# Patient Record
Sex: Male | Born: 1985 | Race: Black or African American | Hispanic: No | Marital: Single | Smoking: Former smoker
Health system: Southern US, Community
[De-identification: ages and names within clinical notes are randomized; demographics above are authoritative.]

## PROBLEM LIST (undated history)

## (undated) HISTORY — PX: HIP FRACTURE SURGERY: SHX118

## (undated) HISTORY — PX: PELVIC FRACTURE SURGERY: SHX119

---

## 2009-09-09 DIAGNOSIS — IMO0002 Reserved for concepts with insufficient information to code with codable children: Secondary | ICD-10-CM | POA: Insufficient documentation

## 2009-09-09 DIAGNOSIS — R0689 Other abnormalities of breathing: Secondary | ICD-10-CM | POA: Insufficient documentation

## 2009-09-09 DIAGNOSIS — S32401A Unspecified fracture of right acetabulum, initial encounter for closed fracture: Secondary | ICD-10-CM | POA: Insufficient documentation

## 2021-08-06 ENCOUNTER — Encounter (HOSPITAL_COMMUNITY): Payer: Self-pay

## 2021-08-06 ENCOUNTER — Ambulatory Visit (INDEPENDENT_AMBULATORY_CARE_PROVIDER_SITE_OTHER): Payer: Self-pay

## 2021-08-06 ENCOUNTER — Other Ambulatory Visit: Payer: Self-pay

## 2021-08-06 ENCOUNTER — Ambulatory Visit (HOSPITAL_COMMUNITY)
Admission: EM | Admit: 2021-08-06 | Discharge: 2021-08-06 | Disposition: A | Discharge status: Home / Self Care | Payer: Self-pay | Attending: Physician Assistant | Admitting: Physician Assistant

## 2021-08-06 DIAGNOSIS — M79675 Pain in left toe(s): Secondary | ICD-10-CM

## 2021-08-06 MED ORDER — SULFAMETHOXAZOLE-TRIMETHOPRIM 800-160 MG PO TABS: 1.0000 | ORAL_TABLET | Freq: Two times a day (BID) | ORAL | 0 refills | Status: AC

## 2021-08-06 MED ORDER — NAPROXEN 500 MG PO TABS: 500.0000 mg | ORAL_TABLET | Freq: Two times a day (BID) | ORAL | 0 refills | Status: DC

## 2021-08-06 NOTE — Discharge Instructions (Signed)
Take Bactrim DS twice daily for 7 days.  If you develop any rash or oral lesions please stop the medication to be seen immediately.  Take Naprosyn twice daily.  Do not take NSAIDs including aspirin, ibuprofen/Advil, naproxen/Aleve with this medication.  Use postop shoe to help provide relief.  Keep your foot elevated and use ice for additional symptom relief.  If symptoms or not improving quickly please call to schedule an appoint with podiatry for follow-up.  If anything worsens return for reevaluation. ?

## 2021-08-06 NOTE — ED Provider Notes (Signed)
?[facility] ? ? ? ?CSN: [id number] ?Arrival date & time: 08/06/21  1911 ? ? ?  ? ?History   ?Chief Complaint ?Chief Complaint  ?Patient presents with  ? Toe Pain  ? ? ?HPI ?Jonathan Nunez is a 36 y.o. male.  ? ?Patient presents today with a several week history of left eye pain.  Reports is gradually been worsening prompting evaluation today.  Pain is rated 8 on a 0-10 pain scale, described as aching, localized to pad of left great toe, worse with palpation or attempted ambulation, no alleviating factors notified.  He did have several pedicures recently but symptoms had already started at the time that he decided to do this.  They addressed fungal disease and placed acrylic toenails on to help with growth.  He denies any history of diabetes.  He denies any fever, nausea, vomiting, lightheadedness.  He denies any recent antibiotic use.  He has not tried any over-the-counter medication for symptom management.  He denies any known injury or change in activity prior to symptom onset.  Denies history of gout.  Denies any increased alcohol consumption or medication changes. ? ? ?History reviewed. No pertinent past medical history. ? ?There are no problems to display for this patient. ? ? ?History reviewed. No pertinent surgical history. ? ? ? ? ?Home Medications   ? ?Prior to Admission medications   ?Medication Sig Start Date End Date Taking? Authorizing Provider  ?naproxen (NAPROSYN) 500 MG tablet Take 1 tablet (500 mg total) by mouth 2 (two) times daily. 08/06/21  Yes Collen Vincent K, PA-C  ?sulfamethoxazole-trimethoprim (BACTRIM DS) 800-160 MG tablet Take 1 tablet by mouth 2 (two) times daily for 7 days. 08/06/21 08/13/21 Yes Mykal Batiz, Derry Skill, PA-C  ? ? ?Family History ?History reviewed. No pertinent family history. ? ?Social History ?  ? ? ?Allergies   ?Patient has no allergy information on record. ? ? ?Review of Systems ?Review of Systems  ?Constitutional:  Positive for activity change. Negative for appetite  change, fatigue and fever.  ?Respiratory:  Negative for cough and shortness of breath.   ?Cardiovascular:  Negative for chest pain.  ?Gastrointestinal:  Negative for abdominal pain, diarrhea, nausea and vomiting.  ?Musculoskeletal:  Positive for arthralgias and joint swelling. Negative for myalgias.  ?Skin:  Negative for color change and wound.  ?Neurological:  Negative for dizziness, light-headedness and headaches.  ? ? ?Physical Exam ?Triage Vital Signs ?ED Triage Vitals  ?Enc Vitals Group  ?   BP 08/06/21 1929 (!) 145/88  ?   Pulse --   ?   Resp 08/06/21 1929 16  ?   Temp 08/06/21 1930 98.3 ?F (36.8 ?C)  ?   Temp Source 08/06/21 1929 Oral  ?   SpO2 08/06/21 1929 100 %  ?   Weight --   ?   Height --   ?   Head Circumference --   ?   Peak Flow --   ?   Pain Score --   ?   Pain Loc --   ?   Pain Edu? --   ?   Excl. in [place]? --   ? ?No data found. ? ?Updated Vital Signs ?BP (!) 145/88 (BP Location: Left Arm)   Temp 98.3 ?F (36.8 ?C)   Resp 16   SpO2 100%  ? ?Visual Acuity ?Right Eye Distance:   ?Left Eye Distance:   ?Bilateral Distance:   ? ?Right Eye Near:   ?Left Eye Near:    ?Bilateral  Near:    ? ?Physical Exam ?Vitals reviewed.  ?Constitutional:   ?   General: He is awake.  ?   Appearance: Normal appearance. He is well-developed. He is not ill-appearing.  ?   Comments: Very pleasant male appears stated age in no acute distress sitting comfortably in exam room  ?HENT:  ?   Head: Normocephalic and atraumatic.  ?   Mouth/Throat:  ?   Pharynx: No oropharyngeal exudate, posterior oropharyngeal erythema or uvula swelling.  ?Cardiovascular:  ?   Rate and Rhythm: Normal rate and regular rhythm.  ?   Pulses:     ?     Posterior tibial pulses are 2+ on the left side.  ?   Heart sounds: Normal heart sounds, S1 normal and S2 normal. No murmur heard. ?Pulmonary:  ?   Effort: Pulmonary effort is normal.  ?   Breath sounds: Normal breath sounds. No stridor. No wheezing, rhonchi or rales.  ?   Comments: Clear to auscultation  bilaterally ?Musculoskeletal:  ?   Left foot: Normal range of motion. No deformity.  ?Feet:  ?   Left foot:  ?   Protective Sensation: 10 sites tested.  10 sites sensed.  ?   Skin integrity: Callus and dry skin present. No ulcer, blister or skin breakdown.  ?   Comments: Unable to assess capillary refill due to acrylic toenail on great toe.  Capillary fill within 2 seconds other toes of left foot with normal pulses.  Foot neurovascularly intact.  Widespread dry skin.  Small callus noted at base of left great toe.  Tenderness to palpation over distal left great toe.  No erythema, wound, bleeding, drainage noted. ?Neurological:  ?   Mental Status: He is alert.  ?Psychiatric:     ?   Behavior: Behavior is cooperative.  ? ? ? ?UC Treatments / Results  ?Labs ?(all labs ordered are listed, but only abnormal results are displayed) ?Labs Reviewed - No data to display ? ?EKG ? ? ?Radiology ?DG Toe Great Left ? ?Result Date: 08/06/2021 ?CLINICAL DATA:  Left first toe pain for 1-2 weeks. No reported injury. EXAM: LEFT GREAT TOE COMPARISON:  None. FINDINGS: No fracture or dislocation. No suspicious focal osseous lesions. No erosions or periosteal reaction. No radiopaque foreign bodies or tophaceous soft tissue calcifications. IMPRESSION: No acute osseous abnormality. Electronically Signed   By: Ilona Sorrel M.D.   On: 08/06/2021 20:05   ? ?Procedures ?Procedures (including critical care time) ? ?Medications Ordered in UC ?Medications - No data to display ? ?Initial Impression / Assessment and Plan / UC Course  ?I have reviewed the triage vital signs and the nursing notes. ? ?Pertinent labs & imaging results that were available during my care of the patient were reviewed by me and considered in my medical decision making (see chart for details). ? ?  ? ?X-ray was obtained given prolonged symptoms with difficulty ambulating which showed no osseous abnormality.  No clear evidence of infection but given clinical presentation and  recent pedicure will cover with Bactrim DS.  He was placed in postop shoe for comfort.  Was also started on Naprosyn for pain relief.  Discussed that he should not take additional NSAIDs with this medication due to risk of GI bleeding.  Recommended conservative treatment measures including RICE protocol.  Discussed that if symptoms or not improving he should follow-up with podiatry and was given contact information for local provider.  Discussed alarm symptoms that warrant emergent evaluation.  Strict  return precautions given to which she expressed understanding.  Work excuse note provided. ? ?Final Clinical Impressions(s) / UC Diagnoses  ? ?Final diagnoses:  ?Pain of toe of left foot  ? ? ? ?Discharge Instructions   ? ?  ?Take Bactrim DS twice daily for 7 days.  If you develop any rash or oral lesions please stop the medication to be seen immediately.  Take Naprosyn twice daily.  Do not take NSAIDs including aspirin, ibuprofen/Advil, naproxen/Aleve with this medication.  Use postop shoe to help provide relief.  Keep your foot elevated and use ice for additional symptom relief.  If symptoms or not improving quickly please call to schedule an appoint with podiatry for follow-up.  If anything worsens return for reevaluation. ? ? ? ? ?ED Prescriptions   ? ? Medication Sig Dispense Auth. Provider  ? sulfamethoxazole-trimethoprim (BACTRIM DS) 800-160 MG tablet Take 1 tablet by mouth 2 (two) times daily for 7 days. 14 tablet Mikel Hardgrove K, PA-C  ? naproxen (NAPROSYN) 500 MG tablet Take 1 tablet (500 mg total) by mouth 2 (two) times daily. 30 tablet Jamil Castillo K, PA-C  ? ?  ? ?PDMP not reviewed this encounter. ?  ?Terrilee Croak, PA-C ?08/06/21 2013 ? ?

## 2021-08-06 NOTE — ED Triage Notes (Signed)
Pt presents for left toe pain x 1-2 weeks. No known injury to his toe. ?

## 2021-09-17 ENCOUNTER — Ambulatory Visit (INDEPENDENT_AMBULATORY_CARE_PROVIDER_SITE_OTHER): Payer: Self-pay | Admitting: Podiatry

## 2021-09-17 ENCOUNTER — Encounter: Payer: Self-pay | Admitting: Podiatry

## 2021-09-17 DIAGNOSIS — M899 Disorder of bone, unspecified: Secondary | ICD-10-CM

## 2021-09-17 DIAGNOSIS — M7752 Other enthesopathy of left foot: Secondary | ICD-10-CM

## 2021-09-17 MED ORDER — MELOXICAM 15 MG PO TABS: 15.0000 mg | ORAL_TABLET | Freq: Every day | ORAL | 0 refills | Status: DC

## 2021-09-17 NOTE — Progress Notes (Signed)
?  Subjective:  ?Patient ID: Jonathan Nunez, male    DOB: January 28, 1986,   MRN: [id number] ? ?Chief Complaint  ?Patient presents with  ? Toe Pain  ?  Plantar hallux and sub 1st MPJ left - aching x 2 months, no injury, hurts to walk a lot, tried change of shoes and callus remover-no help, went to ER - xrayed but no diagnosis   ? New Patient (Initial Visit)  ? ? ?36 y.o. male presents for concern of left hallux and MPJ pain that has been going on for two months. Relates it hurts when walking. Has tried changing shoes with no difference. Went to ER and had X-rays but no diagnosis and has tried callus remover with no help.   . Denies any other pedal complaints. Denies n/v/f/c.  ? ?No past medical history on file. ? ?Objective:  ?Physical Exam: ?Vascular: DP/PT pulses 2/4 bilateral. CFT <3 seconds. Normal hair growth on digits. No edema.  ?Skin. No lacerations or abrasions bilateral feet. Some small hyperkeratotic areas but minimal.  ?Musculoskeletal: MMT 5/5 bilateral lower extremities in DF, PF, Inversion and Eversion. Deceased ROM in DF of ankle joint. Tender to hallux IPJ in area of sesamoid. Some pain to palpation of plantar first MPJ.  ?Neurological: Sensation intact to light touch.  ? ?Assessment:  ? ?1. Capsulitis of toe of left foot   ? ? ? ?Plan:  ?Patient was evaluated and treated and all questions answered. ?Discussed capsulitis and inflammation of joint and treatment options with patient.  ?Radiographs reviewed and discussed with patient.  ?Meloxicam sent to pharmacy.   ?Discussed stiff sole shoes and recommend use of carbon fiber foot plate.  ?Discussed padding  ?Discussed if pain does not improve may consider PT and/or MRI for further surgical planning.  ?Patient to return in 6 weeks or sooner if concerns arise.  ? ? ?Louann Sjogren, DPM  ? ? ?

## 2021-10-29 ENCOUNTER — Ambulatory Visit (INDEPENDENT_AMBULATORY_CARE_PROVIDER_SITE_OTHER): Payer: Self-pay | Admitting: Podiatry

## 2021-10-29 DIAGNOSIS — Z91199 Patient's noncompliance with other medical treatment and regimen due to unspecified reason: Secondary | ICD-10-CM

## 2021-10-29 NOTE — Progress Notes (Signed)
No show

## 2022-01-24 ENCOUNTER — Ambulatory Visit (HOSPITAL_COMMUNITY)
Admission: EM | Admit: 2022-01-24 | Discharge: 2022-01-24 | Disposition: A | Discharge status: Home / Self Care | Payer: Self-pay | Attending: Physician Assistant | Admitting: Physician Assistant

## 2022-01-24 ENCOUNTER — Encounter (HOSPITAL_COMMUNITY): Payer: Self-pay

## 2022-01-24 DIAGNOSIS — R809 Proteinuria, unspecified: Secondary | ICD-10-CM

## 2022-01-24 DIAGNOSIS — R5383 Other fatigue: Secondary | ICD-10-CM | POA: Insufficient documentation

## 2022-01-24 DIAGNOSIS — R42 Dizziness and giddiness: Secondary | ICD-10-CM

## 2022-01-24 DIAGNOSIS — R002 Palpitations: Secondary | ICD-10-CM

## 2022-01-24 DIAGNOSIS — R197 Diarrhea, unspecified: Secondary | ICD-10-CM

## 2022-01-24 DIAGNOSIS — R5381 Other malaise: Secondary | ICD-10-CM

## 2022-01-24 LAB — POCT URINALYSIS DIPSTICK, ED / UC
Bilirubin Urine: NEGATIVE
Glucose, UA: NEGATIVE mg/dL
Hgb urine dipstick: NEGATIVE
Leukocytes,Ua: NEGATIVE
Nitrite: NEGATIVE
Protein, ur: >=300 mg/dL — AB
Specific Gravity, Urine: >=1.03 (ref 1.005–1.030)
Urobilinogen, UA: 1 mg/dL (ref 0.0–1.0)
pH: 7 (ref 5.0–8.0)

## 2022-01-24 LAB — COMPREHENSIVE METABOLIC PANEL
ALT: 55 U/L — ABNORMAL HIGH (ref 0–44)
AST: 118 U/L — ABNORMAL HIGH (ref 15–41)
Albumin: 5 g/dL (ref 3.5–5.0)
Alkaline Phosphatase: 51 U/L (ref 38–126)
Anion gap: 13 (ref 5–15)
BUN: 11 mg/dL (ref 6–20)
CO2: 28 mmol/L (ref 22–32)
Calcium: 9.7 mg/dL (ref 8.9–10.3)
Chloride: 96 mmol/L — ABNORMAL LOW (ref 98–111)
Creatinine, Ser: 0.78 mg/dL (ref 0.61–1.24)
GFR, Estimated: >60 mL/min (ref >60)
Glucose, Bld: 93 mg/dL (ref 70–99)
Potassium: 4.4 mmol/L (ref 3.5–5.1)
Sodium: 137 mmol/L (ref 135–145)
Total Bilirubin: 0.7 mg/dL (ref 0.3–1.2)
Total Protein: 7.7 g/dL (ref 6.5–8.1)

## 2022-01-24 LAB — CBC
HCT: 20.6 % — ABNORMAL LOW (ref 39.0–52.0)
Hemoglobin: 7.1 g/dL — ABNORMAL LOW (ref 13.0–17.0)
MCH: 37 pg — ABNORMAL HIGH (ref 26.0–34.0)
MCHC: 34.5 g/dL (ref 30.0–36.0)
MCV: 107.3 fL — ABNORMAL HIGH (ref 80.0–100.0)
Platelets: 123 10*3/uL — ABNORMAL LOW (ref 150–400)
RBC: 1.92 MIL/uL — ABNORMAL LOW (ref 4.22–5.81)
RDW: 26.2 % — ABNORMAL HIGH (ref 11.5–15.5)
WBC: 8.1 10*3/uL (ref 4.0–10.5)
nRBC: 5.9 % — ABNORMAL HIGH (ref 0.0–0.2)

## 2022-01-24 LAB — TSH: TSH: 1.827 u[IU]/mL (ref 0.350–4.500)

## 2022-01-24 LAB — CBG MONITORING, ED: Glucose-Capillary: 99 mg/dL (ref 70–99)

## 2022-01-24 LAB — T4, FREE: Free T4: 0.69 ng/dL (ref 0.61–1.12)
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
## 2022-01-24 NOTE — ED Triage Notes (Signed)
Pt states has been out of work since Wednesday, states has had 3 episodes of dizziness and fatigue. States has had diarrhea and no appetite in over 2 wks.

## 2022-01-24 NOTE — Discharge Instructions (Signed)
Your EKG was normal.  Your urine did have some protein so we are ordering additional studies to investigate this further.  Your blood sugar was normal.  You do not have any blood in your stool.  We will contact you with your lab work within a few days.  Make sure that you rest and drink plenty of fluid.  Eat small frequent meals.  It is important that you follow-up with a primary care provider.  We will try to establish you with someone and they should reach out to call and schedule an appointment.  In the meantime I do recommend you follow-up with our clinic within the week.  If you have any worsening symptoms including chest pain, shortness of breath, headache, vision change, weakness, passing out you need to go to the emergency room.  I also recommend that you follow-up with a cardiologist given your lightheadedness and palpitations.  Call them to schedule an appointment.

## 2022-01-25 ENCOUNTER — Observation Stay (HOSPITAL_BASED_OUTPATIENT_CLINIC_OR_DEPARTMENT_OTHER)
Admission: EM | Admit: 2022-01-25 | Discharge: 2022-01-27 | Disposition: A | Discharge status: Home / Self Care | Payer: Self-pay | Attending: Internal Medicine | Admitting: Internal Medicine

## 2022-01-25 ENCOUNTER — Encounter (HOSPITAL_BASED_OUTPATIENT_CLINIC_OR_DEPARTMENT_OTHER): Payer: Self-pay | Admitting: Emergency Medicine

## 2022-01-25 ENCOUNTER — Other Ambulatory Visit: Payer: Self-pay

## 2022-01-25 ENCOUNTER — Encounter (HOSPITAL_COMMUNITY): Payer: Self-pay

## 2022-01-25 DIAGNOSIS — Z87891 Personal history of nicotine dependence: Secondary | ICD-10-CM | POA: Insufficient documentation

## 2022-01-25 DIAGNOSIS — D649 Anemia, unspecified: Secondary | ICD-10-CM

## 2022-01-25 DIAGNOSIS — D696 Thrombocytopenia, unspecified: Secondary | ICD-10-CM | POA: Insufficient documentation

## 2022-01-25 DIAGNOSIS — D539 Nutritional anemia, unspecified: Principal | ICD-10-CM

## 2022-01-25 DIAGNOSIS — E538 Deficiency of other specified B group vitamins: Secondary | ICD-10-CM | POA: Insufficient documentation

## 2022-01-25 LAB — BASIC METABOLIC PANEL
Anion gap: 8 (ref 5–15)
BUN: 12 mg/dL (ref 6–20)
CO2: 28 mmol/L (ref 22–32)
Calcium: 8.9 mg/dL (ref 8.9–10.3)
Chloride: 100 mmol/L (ref 98–111)
Creatinine, Ser: 0.88 mg/dL (ref 0.61–1.24)
GFR, Estimated: >60 mL/min (ref >60)
Glucose, Bld: 102 mg/dL — ABNORMAL HIGH (ref 70–99)
Potassium: 3.9 mmol/L (ref 3.5–5.1)
Sodium: 136 mmol/L (ref 135–145)

## 2022-01-25 LAB — CBC
HCT: 19.6 % — ABNORMAL LOW (ref 39.0–52.0)
Hemoglobin: 6.8 g/dL — CL (ref 13.0–17.0)
MCH: 36.6 pg — ABNORMAL HIGH (ref 26.0–34.0)
MCHC: 34.7 g/dL (ref 30.0–36.0)
MCV: 105.4 fL — ABNORMAL HIGH (ref 80.0–100.0)
Platelets: 117 10*3/uL — ABNORMAL LOW (ref 150–400)
RBC: 1.86 MIL/uL — ABNORMAL LOW (ref 4.22–5.81)
RDW: 28 % — ABNORMAL HIGH (ref 11.5–15.5)
WBC: 7.1 10*3/uL (ref 4.0–10.5)
nRBC: 6.7 % — ABNORMAL HIGH (ref 0.0–0.2)

## 2022-01-25 LAB — FERRITIN: Ferritin: 81 ng/mL (ref 24–336)

## 2022-01-25 LAB — RETICULOCYTES
Immature Retic Fract: 13.1 % (ref 2.3–15.9)
RBC.: 1.83 MIL/uL — ABNORMAL LOW (ref 4.22–5.81)
Retic Count, Absolute: 30 10*3/uL (ref 19.0–186.0)
Retic Ct Pct: 1.6 % (ref 0.4–3.1)

## 2022-01-25 LAB — IRON AND TIBC
Iron: 137 ug/dL (ref 45–182)
Saturation Ratios: 43 % — ABNORMAL HIGH (ref 17.9–39.5)
TIBC: 318 ug/dL (ref 250–450)
UIBC: 181 ug/dL

## 2022-01-25 LAB — FOLATE: Folate: 20.8 ng/mL (ref >5.9)

## 2022-01-25 LAB — VITAMIN B12: Vitamin B-12: 73 pg/mL — ABNORMAL LOW (ref 180–914)

## 2022-01-25 LAB — PREPARE RBC (CROSSMATCH)

## 2022-01-25 MED ORDER — ONDANSETRON HCL 4 MG PO TABS: 4.0000 mg | ORAL_TABLET | Freq: Four times a day (QID) | ORAL | Status: DC | PRN

## 2022-01-25 MED ORDER — ONDANSETRON HCL 4 MG/2ML IJ SOLN: 4.0000 mg | Freq: Four times a day (QID) | INTRAMUSCULAR | Status: DC | PRN

## 2022-01-25 MED ORDER — ACETAMINOPHEN 325 MG PO TABS: 650.0000 mg | ORAL_TABLET | Freq: Four times a day (QID) | ORAL | Status: DC | PRN

## 2022-01-25 MED ORDER — CYANOCOBALAMIN 1000 MCG/ML IJ SOLN
1000.0000 ug | Freq: Once | INTRAMUSCULAR | Status: AC
  Administered 2022-01-26: 1000 ug via INTRAMUSCULAR
  Filled 2022-01-25: qty 1

## 2022-01-25 MED ORDER — SODIUM CHLORIDE 0.9 % IV SOLN
10.0000 mL/h | Freq: Once | INTRAVENOUS | Status: AC
  Administered 2022-01-26: 10 mL/h via INTRAVENOUS

## 2022-01-25 MED ORDER — ACETAMINOPHEN 650 MG RE SUPP: 650.0000 mg | Freq: Four times a day (QID) | RECTAL | Status: DC | PRN

## 2022-01-25 MED ORDER — SENNOSIDES-DOCUSATE SODIUM 8.6-50 MG PO TABS: 1.0000 | ORAL_TABLET | Freq: Every evening | ORAL | Status: DC | PRN

## 2022-01-25 NOTE — ED Notes (Signed)
[facility] on unit to transport pt  

## 2022-01-25 NOTE — ED Provider Notes (Signed)
[facility] EMERGENCY DEPARTMENT Provider Note   CSN: [id number] Arrival date & time: 01/25/22  1712     History  Chief Complaint  Patient presents with   Near Syncope    Jonathan Nunez is a 36 y.o. male.  Patient presents to the emergency department today for symptomatic anemia.  Patient presented to urgent care yesterday for several days of lightheadedness, fatigue, exertional shortness of breath.  This has been occurring for the past 1 to 2 weeks.  Patient has had some diarrhea, dark appearing, however reports green stool today.  Labs drawn at urgent care yesterday revealed microcytic anemia.  Patient denies having similar symptoms in the past.  He has never needed a blood transfusion.  He denies history of peptic ulcer disease, heavy NSAID use.  Was prescribed an NSAID after podiatry appointment earlier in the year, but is not on medication chronically stating he only took this a couple of times earlier this year.  He denies alcohol use.  He states that he is an "occasional" smoker.  Denies family history of blood problems or need for transfusion.  Hemoccult and thyroid studies yesterday were negative.       Home Medications Prior to Admission medications   Medication Sig Start Date End Date Taking? Authorizing Provider  meloxicam (MOBIC) 15 MG tablet Take 1 tablet (15 mg total) by mouth daily. 09/17/21   Louann Sjogren, DPM      Allergies    Patient has no known allergies.    Review of Systems   Review of Systems  Physical Exam Updated Vital Signs BP 130/79   Pulse 72   Temp 98.3 F (36.8 C) (Oral)   Resp 18   Ht 5\' 8"  (1.727 m)   Wt 72.6 kg   SpO2 100%   BMI 24.33 kg/m   Physical Exam Vitals and nursing note reviewed.  Constitutional:      General: He is not in acute distress.    Appearance: He is well-developed.  HENT:     Head: Normocephalic and atraumatic.     Right Ear: External ear normal.     Left Ear: External ear normal.     Mouth/Throat:      Mouth: Mucous membranes are moist.  Eyes:     General:        Right eye: No discharge.        Left eye: No discharge.     Comments: Pale conjunctiva  Cardiovascular:     Rate and Rhythm: Normal rate and regular rhythm.     Heart sounds: Normal heart sounds.  Pulmonary:     Effort: Pulmonary effort is normal.     Breath sounds: Normal breath sounds.  Abdominal:     Palpations: Abdomen is soft.     Tenderness: There is no abdominal tenderness.  Musculoskeletal:     Cervical back: Normal range of motion and neck supple.  Skin:    General: Skin is warm and dry.  Neurological:     Mental Status: He is alert.     ED Results / Procedures / Treatments   Labs (all labs ordered are listed, but only abnormal results are displayed) Labs Reviewed  BASIC METABOLIC PANEL - Abnormal; Notable for the following components:      Result Value   Glucose, Bld 102 (*)    All other components within normal limits  CBC - Abnormal; Notable for the following components:   RBC 1.86 (*)    Hemoglobin 6.8 (*)  HCT 19.6 (*)    MCV 105.4 (*)    MCH 36.6 (*)    RDW 28.0 (*)    Platelets 117 (*)    nRBC 6.7 (*)    All other components within normal limits  VITAMIN B12  IRON AND TIBC  FERRITIN  FOLATE  RETICULOCYTES    EKG None  Radiology No results found.  Procedures Procedures    Medications Ordered in ED Medications - No data to display  ED Course/ Medical Decision Making/ A&P    Patient seen and examined. History obtained directly from patient.  Reviewed previous clinic notes.  Reviewed urgent care note from yesterday.  Labs/EKG: CBC and BMP ordered in triage personally reviewed and interpreted.  BMP unremarkable, CBC with hemoglobin 6.8 with MCV elevated 105.4.  Anemia panel ordered.  Imaging: None ordered  Medications/Fluids: None ordered  Most recent vital signs reviewed and are as follows: BP 130/79   Pulse 72   Temp 98.3 F (36.8 C) (Oral)   Resp 18   Ht 5'  8" (1.727 m)   Wt 72.6 kg   SpO2 100%   BMI 24.33 kg/m   Initial impression: Macrocytic anemia, symptomatic, he would need transfusion and further work-up.  Degree of macrocytosis makes me think that this is in part chronic, however however has had development of symptoms recently and dark stools.  8:25 PM Pt discussed earlier with Dr. Anitra Lauth. Reassessment performed. Patient appears stable.  Patient initially adamant about driving himself to the emergency department because his vehicle would be left behind.  After discussion of risks and benefits, he is amenable to [facility] transport.  I consulted with Dr. Margo Aye, [facility] hospitalist by telephone.  She requests ED to ED transfer so that blood transfusion can be started.  Requested GI consult if patient is on chronic NSAIDs, which he is not, so will defer.  Guaiac yesterday was negative.  No abdominal pain.  Discussed with Dr. Jeraldine Loots at [facility] ED via secure chat who accepts patient.  Reviewed pertinent lab work and imaging with patient at bedside. Questions answered.   Most current vital signs reviewed and are as follows: BP 132/68   Pulse 79   Temp 98.3 F (36.8 C)   Resp 18   Ht 5\' 8"  (1.727 m)   Wt 72.6 kg   SpO2 100%   BMI 24.33 kg/m   Plan: Admit to hospital.                            Medical Decision Making Amount and/or Complexity of Data Reviewed Labs: ordered.  Risk Decision regarding hospitalization.   Patient with symptomatic anemia, progressive symptoms.  No concerns for active GI bleeding.  Vital signs are stable without tachycardia or hypotension.  He will be admitted for blood transfusion and anemia work-up.  No indication of acute GI bleeding at this point.        Final Clinical Impression(s) / ED Diagnoses Final diagnoses:  Macrocytic anemia    Rx / DC Orders ED Discharge Orders     None         , PA-C 01/25/22 2028    2029, MD 01/28/22 1506

## 2022-01-25 NOTE — ED Notes (Addendum)
Patient denies any dizziness to today. Patient denies any chest pain,shortness of breath.Reports diarrhea x2 yesterday

## 2022-01-25 NOTE — ED Notes (Signed)
ED TO INPATIENT HANDOFF REPORT  ED Nurse Name and Phone #: Julian Reil [phone]  S Name/Age/Gender Jonathan Nunez 36 y.o. male Room/Bed: WA07/WA07  Code Status   Code Status: Full Code  Home/SNF/Other Home Patient oriented to: self, place, time, and situation Is this baseline? Yes   Triage Complete: Triage complete  Chief Complaint Symptomatic anemia [D64.9]  Triage Note Patient reports intermittent dizziness and fatigue for the last 2 weeks, states was messaged by his provider yesterday and was told he had low blood counts, but did not tell him what levels were low.    Allergies No Known Allergies  Level of Care/Admitting Diagnosis ED Disposition     ED Disposition  Admit   Condition  --   Comment  Hospital Area: [facility] [[id number]]  Level of Care: Med-Surg [16]  May place patient in observation at [facility] or [facility] if equivalent level of care is available:: Yes  Covid Evaluation: Asymptomatic - no recent exposure (last 10 days) testing not required  Diagnosis: Symptomatic anemia [[id number]]  Admitting Physician: Briscoe Deutscher [[id number]]  Attending Physician: Briscoe Deutscher [[id number]]          B Medical/Surgery History History reviewed. No pertinent past medical history. Past Surgical History:  Procedure Laterality Date   HIP FRACTURE SURGERY       A IV Location/Drains/Wounds Patient Lines/Drains/Airways Status     Active Line/Drains/Airways     Name Placement date Placement time Site Days   Peripheral IV 01/25/22 20 G Right Antecubital 01/25/22  1924  Antecubital  less than 1            Intake/Output Last 24 hours No intake or output data in the 24 hours ending 01/25/22 2311  Labs/Imaging Results for orders placed or performed during the hospital encounter of 01/25/22 (from the past 48 hour(s))  Basic metabolic panel     Status: Abnormal   Collection Time: 01/25/22  5:32 PM  Result Value Ref Range   Sodium 136  135 - 145 mmol/L   Potassium 3.9 3.5 - 5.1 mmol/L   Chloride 100 98 - 111 mmol/L   CO2 28 22 - 32 mmol/L   Glucose, Bld 102 (H) 70 - 99 mg/dL    Comment: Glucose reference range applies only to samples taken after fasting for at least 8 hours.   BUN 12 6 - 20 mg/dL   Creatinine, Ser 9.92 0.61 - 1.24 mg/dL   Calcium 8.9 8.9 - 42.6 mg/dL   GFR, Estimated >83 >41 mL/min    Comment: (NOTE) Calculated using the CKD-EPI Creatinine Equation (2021)    Anion gap 8 5 - 15    Comment: Performed at [facility], [address] [place], [place] [address]  CBC     Status: Abnormal   Collection Time: 01/25/22  5:32 PM  Result Value Ref Range   WBC 7.1 4.0 - 10.5 K/uL    Comment: REPEATED TO VERIFY WHITE COUNT CONFIRMED ON SMEAR    RBC 1.86 (L) 4.22 - 5.81 MIL/uL   Hemoglobin 6.8 (LL) 13.0 - 17.0 g/dL    Comment: REPEATED TO VERIFY THIS CRITICAL RESULT HAS VERIFIED AND BEEN CALLED TO GOUGE S RN BY CAROL PHILLIPS ON 09 09 2023 AT 1751, AND HAS BEEN READ BACK.     HCT 19.6 (L) 39.0 - 52.0 %    Comment: REPEATED TO VERIFY   MCV 105.4 (H) 80.0 - 100.0 fL   MCH 36.6 (  H) 26.0 - 34.0 pg   MCHC 34.7 30.0 - 36.0 g/dL   RDW 25.4 (H) 98.2 - 64.1 %   Platelets 117 (L) 150 - 400 K/uL    Comment: SPECIMEN CHECKED FOR CLOTS REPEATED TO VERIFY PLATELET COUNT CONFIRMED BY SMEAR    nRBC 6.7 (H) 0.0 - 0.2 %    Comment: Performed at [facility], [address] [place], [place] [address]  Reticulocytes     Status: Abnormal   Collection Time: 01/25/22  5:32 PM  Result Value Ref Range   Retic Ct Pct 1.6 0.4 - 3.1 %   RBC. 1.83 (L) 4.22 - 5.81 MIL/uL   Retic Count, Absolute 30.0 19.0 - 186.0 K/uL    Comment: RESULTS CONFIRMED BY MANUAL DILUTION   Immature Retic Fract 13.1 2.3 - 15.9 %    Comment: Performed at [facility], [address], [place], [place] [address]  Vitamin B12     Status: Abnormal   Collection Time: 01/25/22  7:05 PM  Result Value Ref Range   Vitamin B-12  73 (L) 180 - 914 pg/mL    Comment: (NOTE) This assay is not validated for testing neonatal or myeloproliferative syndrome specimens for Vitamin B12 levels. Performed at [facility], [address], [place], [place] [address]   Iron and TIBC     Status: Abnormal   Collection Time: 01/25/22  7:05 PM  Result Value Ref Range   Iron 137 45 - 182 ug/dL   TIBC 031 594 - 585 ug/dL   Saturation Ratios 43 (H) 17.9 - 39.5 %   UIBC 181 ug/dL    Comment: Performed at [facility], [address], [place], [place] [address]  Ferritin     Status: None   Collection Time: 01/25/22  7:05 PM  Result Value Ref Range   Ferritin 81 24 - 336 ng/mL    Comment: Performed at [facility], [address] [place], [place] [address]  Folate     Status: None   Collection Time: 01/25/22  7:05 PM  Result Value Ref Range   Folate 20.8 >5.9 ng/mL    Comment: Performed at [facility], [address] [place], [place] [address]   No results found.  Pending Labs Unresulted Labs (From admission, onward)     Start     Ordered   01/26/22 0500  HIV Antibody (routine testing w rflx)  (HIV Antibody (Routine testing w reflex) panel)  Tomorrow morning,   R        01/25/22 2257   01/26/22 0500  Comprehensive metabolic panel  Tomorrow morning,   R        01/25/22 2257   01/26/22 0500  Intrinsic Factor Antibodies  Tomorrow morning,   R        01/25/22 2303   01/25/22 2250  ABO/Rh  Once,   R        01/25/22 2250   01/25/22 2154  Prepare RBC (crossmatch)  (Adult Blood Administration - PRBC)  Once,   R       Question Answer Comment  # of Units 1 unit   Transfusion Indications Symptomatic Anemia   Number of Units to Keep Ahead NO units ahead   If emergent release call blood bank Not emergent release      01/25/22 2153   01/25/22 2153  Type and screen [facility]  Once,   STAT  [facility]    01/25/22 2153             Vitals/Pain Today's Vitals   01/25/22 2100 01/25/22 2153 01/25/22 2158 01/25/22 2159  BP: 116/69 (!) 121/55 130/65   Pulse: 78  89 89  Resp:   19 18  Temp:    98.7 F (37.1 C)  TempSrc:    Oral  SpO2: 100%  100%   Weight:      Height:      PainSc:        Isolation Precautions No active isolations  Medications Medications  0.9 %  sodium chloride infusion (has no administration in time range)  acetaminophen (TYLENOL) tablet 650 mg (has no administration in time range)    Or  acetaminophen (TYLENOL) suppository 650 mg (has no administration in time range)  senna-docusate (Senokot-S) tablet 1 tablet (has no administration in time range)  ondansetron (ZOFRAN) tablet 4 mg (has no administration in time range)    Or  ondansetron (ZOFRAN) injection 4 mg (has no administration in time range)  cyanocobalamin (VITAMIN B12) injection 1,000 mcg (has no administration in time range)    Mobility walks Low fall risk   Focused Assessments    R Recommendations: See Admitting Provider Note  Report given to:   Additional Notes:

## 2022-01-25 NOTE — ED Triage Notes (Signed)
Patient reports intermittent dizziness and fatigue for the last 2 weeks, states was messaged by his provider yesterday and was told he had low blood counts, but did not tell him what levels were low.

## 2022-01-25 NOTE — ED Notes (Addendum)
Pt BIB EMS from [facility] w/ a hemoglobin of 6.8. Went to Urgent Care yesterday for fatigue and dizziness. Fecal occult was negative and no signs of GI bleed.

## 2022-01-25 NOTE — ED Notes (Signed)
Rn tired to draw blood off iv line lost access . Dure to previous labs clotting .

## 2022-01-25 NOTE — ED Provider Notes (Signed)
Care assumed from PA [facility] after patient was transferred ED to ED from [facility], please see his note for full details, but in brief Jonathan Nunez is a 36 y.o. male who presents for 1 to 2 weeks of fatigue, lightheadedness and exertional shortness of breath.  Has had some intermittent dark stools, but no dark stools today.  Labs drawn at urgent care yesterday that showed anemia, no known history of anemia previously or any prior blood transfusions.   Patient found to have a hemoglobin of 6.8.  Hemoccult negative yesterday.  Hospitalist consulted for admission, Dr. Margo Aye requested ED to ED transfer for transfusion  Upon arrival type and screen and 1 unit PRBCs ordered and hospitalist consulted.  Case discussed with Dr. Antionette Char who will see and admit the patient for symptomatic anemia  .Critical Care  Performed by: Dartha Lodge, PA-C Authorized by: Dartha Lodge, PA-C   Critical care provider statement:    Critical care time (minutes):  30   Critical care was time spent personally by me on the following activities:  Development of treatment plan with patient or surrogate, discussions with consultants, evaluation of patient's response to treatment, examination of patient, ordering and review of laboratory studies, ordering and review of radiographic studies, ordering and performing treatments and interventions, pulse oximetry, re-evaluation of patient's condition and review of old charts     Dartha Lodge, Cordelia Poche 01/25/22 2323    Virgina Norfolk, DO 01/25/22 2329

## 2022-01-25 NOTE — H&P (Addendum)
History and Physical    Jonathan Nunez JOI:[id number] DOB: 09-Oct-1985 DOA: 01/25/2022  PCP: Pcp, No   Patient coming from: Home   Chief Complaint: Lightheaded, fatigued, anemia  HPI: Jonathan Nunez is a 36 y.o. male who denies any significant past medical history now presents emergency department for evaluation of lightheadedness, fatigue, and anemia.  Patient reports approximately 3 days of lightheadedness on standing, fatigue, and occasional palpitations.  He was evaluated for this at an urgent care yesterday where he was found to be anemic.  Per urgent care PA documentation, DRE and Hemoccult were normal.  Patient reports having some dark brown stool a few days ago but none since, and he denies any hematochezia, abdominal pain, or vomiting.  He denies any recent NSAID use.  He only rarely uses alcohol and not excessively.  Denies any history of liver disease.  [facility] ED Course: Upon arrival to the ED, patient is found to be afebrile and saturating well on room air with normal heart rate and stable blood pressure.  BMP is normal and CBC notable for microcytic anemia with hemoglobin 6.8, and a thrombocytopenia with platelets 117,000.  Anemia panel was sent and patient was transferred to [facility] where 1 unit of RBC ordered for transfusion and hospitalists asked to admit.   Review of Systems:  All other systems reviewed and apart from HPI, are negative.  History reviewed. No pertinent past medical history.  Past Surgical History:  Procedure Laterality Date   HIP FRACTURE SURGERY      Social History:   reports that he has quit smoking. His smoking use included cigarettes. He has never used smokeless tobacco. He reports that he does not currently use alcohol. He reports that he does not currently use drugs.  No Known Allergies  History reviewed. No pertinent family history.   Prior to Admission medications   Medication Sig Start Date End Date Taking? Authorizing Provider   Bismuth Subsalicylate (KAOPECTATE) 262 MG TABS Take 1 tablet by mouth daily as needed (diarrhea).   Yes [provider]  Multiple Vitamins-Minerals (MULTIVITAMIN ADULTS) TABS Take 1 tablet by mouth daily.   Yes [provider]    Physical Exam: Vitals:   01/25/22 2100 01/25/22 2153 01/25/22 2158 01/25/22 2159  BP: 116/69 (!) 121/55 130/65   Pulse: 78  89 89  Resp:   19 18  Temp:    98.7 F (37.1 C)  TempSrc:    Oral  SpO2: 100%  100%   Weight:      Height:        Constitutional: NAD, no diaphoresis   Eyes: PERTLA, lids and conjunctivae normal ENMT: Mucous membranes are moist. Posterior pharynx clear of any exudate or lesions.   Neck: supple, no masses  Respiratory: no wheezing, no crackles. No accessory muscle use.  Cardiovascular: S1 & S2 heard, regular rate and rhythm. No extremity edema.   Abdomen: No distension, no tenderness, soft. Bowel sounds active.  Musculoskeletal: no clubbing / cyanosis. No joint deformity upper and lower extremities.   Skin: no significant rashes, lesions, ulcers. Warm, dry, well-perfused. Neurologic: CN 2-12 grossly intact. Moving all extremities. Alert and oriented.  Psychiatric: Calm. Cooperative.    Labs and Imaging on Admission: I have personally reviewed following labs and imaging studies  CBC: Recent Labs  Lab 01/24/22 1741 01/25/22 1732  WBC 8.1 7.1  HGB 7.1* 6.8*  HCT 20.6* 19.6*  MCV 107.3* 105.4*  PLT 123* 117*   Basic Metabolic Panel: Recent Labs  Lab 01/24/22 1741 01/25/22 1732  NA 137 136  K 4.4 3.9  CL 96* 100  CO2 28 28  GLUCOSE 93 102*  BUN 11 12  CREATININE 0.78 0.88  CALCIUM 9.7 8.9   GFR: Estimated Creatinine Clearance: 115.5 mL/min (by C-G formula based on SCr of 0.88 mg/dL). Liver Function Tests: Recent Labs  Lab 01/24/22 1741  AST 118*  ALT 55*  ALKPHOS 51  BILITOT 0.7  PROT 7.7  ALBUMIN 5.0   No results for input(s): "LIPASE", "AMYLASE" in the last 168 hours. No results for  input(s): "AMMONIA" in the last 168 hours. Coagulation Profile: No results for input(s): "INR", "PROTIME" in the last 168 hours. Cardiac Enzymes: No results for input(s): "CKTOTAL", "CKMB", "CKMBINDEX", "TROPONINI" in the last 168 hours. BNP (last 3 results) No results for input(s): "PROBNP" in the last 8760 hours. HbA1C: No results for input(s): "HGBA1C" in the last 72 hours. CBG: Recent Labs  Lab 01/24/22 1747  GLUCAP 99   Lipid Profile: No results for input(s): "CHOL", "HDL", "LDLCALC", "TRIG", "CHOLHDL", "LDLDIRECT" in the last 72 hours. Thyroid Function Tests: Recent Labs    01/24/22 1741 01/24/22 1800  TSH  --  1.827  FREET4 0.69  --    Anemia Panel: Recent Labs    01/25/22 1732 01/25/22 1905  VITAMINB12  --  73*  FOLATE  --  20.8  FERRITIN  --  81  TIBC  --  318  IRON  --  137  RETICCTPCT 1.6  --    Urine analysis:    Component Value Date/Time   LABSPEC >=1.030 01/24/2022 1800   PHURINE 7.0 01/24/2022 1800   GLUCOSEU NEGATIVE 01/24/2022 1800   HGBUR NEGATIVE 01/24/2022 1800   BILIRUBINUR NEGATIVE 01/24/2022 1800   KETONESUR TRACE (A) 01/24/2022 1800   PROTEINUR >=300 (A) 01/24/2022 1800   UROBILINOGEN 1.0 01/24/2022 1800   NITRITE NEGATIVE 01/24/2022 1800   LEUKOCYTESUR NEGATIVE 01/24/2022 1800   Sepsis Labs: @LABRCNTIP (procalcitonin:4,lacticidven:4) )No results found for this or any previous visit (from the past 240 hour(s)).   Radiological Exams on Admission: No results found.  EKG: Independently reviewed. SR.   Assessment/Plan   1. Symptomatic anemia; thrombocytopenia  - Presents with lightheadedness and fatigue and found to have Hgb 6.8 with MCV 105.4 and platelets 117,000  - DRE and Hemoccult were normal at urgent care yesterday per documentation  - 1 unit RBC ordered for transfusion from ED  - Follow-up anemia panel, check post-transfusion CBC   ADDENDUM: B12 is low. Plan to give 1000 mcg IM B12 and check antibodies to intrinsic factor.     DVT prophylaxis: SCDs  Code Status: Full  Level of Care: Level of care: Med-Surg Family Communication: none present  Disposition Plan:  Patient is from: home  Anticipated d/c is to: Home  Anticipated d/c date is: 01/26/22  Patient currently: Pending post-transfusion CBC  Consults called: none Admission status: Observation     03/28/22, MD [facility]  01/25/2022, 10:58 PM

## 2022-01-26 LAB — HEPATIC FUNCTION PANEL
ALT: 49 U/L — ABNORMAL HIGH (ref 0–44)
AST: 99 U/L — ABNORMAL HIGH (ref 15–41)
Albumin: 4.5 g/dL (ref 3.5–5.0)
Alkaline Phosphatase: 55 U/L (ref 38–126)
Bilirubin, Direct: 0.2 mg/dL (ref 0.0–0.2)
Indirect Bilirubin: 0.7 mg/dL (ref 0.3–0.9)
Total Bilirubin: 0.9 mg/dL (ref 0.3–1.2)
Total Protein: 7.2 g/dL (ref 6.5–8.1)

## 2022-01-26 LAB — COMPREHENSIVE METABOLIC PANEL
ALT: 53 U/L — ABNORMAL HIGH (ref 0–44)
AST: 98 U/L — ABNORMAL HIGH (ref 15–41)
Albumin: 5.1 g/dL — ABNORMAL HIGH (ref 3.5–5.0)
Alkaline Phosphatase: 63 U/L (ref 38–126)
Anion gap: 8 (ref 5–15)
BUN: 11 mg/dL (ref 6–20)
CO2: 28 mmol/L (ref 22–32)
Calcium: 9.1 mg/dL (ref 8.9–10.3)
Chloride: 104 mmol/L (ref 98–111)
Creatinine, Ser: 0.76 mg/dL (ref 0.61–1.24)
GFR, Estimated: >60 mL/min (ref >60)
Glucose, Bld: 104 mg/dL — ABNORMAL HIGH (ref 70–99)
Potassium: 3.7 mmol/L (ref 3.5–5.1)
Sodium: 140 mmol/L (ref 135–145)
Total Bilirubin: 0.7 mg/dL (ref 0.3–1.2)
Total Protein: 7.8 g/dL (ref 6.5–8.1)

## 2022-01-26 LAB — CBC
HCT: 24 % — ABNORMAL LOW (ref 39.0–52.0)
Hemoglobin: 8 g/dL — ABNORMAL LOW (ref 13.0–17.0)
MCH: 35.7 pg — ABNORMAL HIGH (ref 26.0–34.0)
MCHC: 33.3 g/dL (ref 30.0–36.0)
MCV: 107.1 fL — ABNORMAL HIGH (ref 80.0–100.0)
Platelets: 104 10*3/uL — ABNORMAL LOW (ref 150–400)
RBC: 2.24 MIL/uL — ABNORMAL LOW (ref 4.22–5.81)
WBC: 7.5 10*3/uL (ref 4.0–10.5)
nRBC: 8.2 % — ABNORMAL HIGH (ref 0.0–0.2)

## 2022-01-26 LAB — HEMOGLOBIN AND HEMATOCRIT, BLOOD
HCT: 23.7 % — ABNORMAL LOW (ref 39.0–52.0)
Hemoglobin: 7.9 g/dL — ABNORMAL LOW (ref 13.0–17.0)

## 2022-01-26 LAB — ABO/RH: ABO/RH(D): B POS

## 2022-01-26 MED ORDER — CYANOCOBALAMIN 1000 MCG/ML IJ SOLN
1000.0000 ug | Freq: Every day | INTRAMUSCULAR | Status: DC
  Administered 2022-01-26 – 2022-01-27 (×2): 1000 ug via INTRAMUSCULAR
  Filled 2022-01-26 (×2): qty 1

## 2022-01-26 MED ORDER — FERROUS SULFATE 325 (65 FE) MG PO TABS
325.0000 mg | ORAL_TABLET | Freq: Every day | ORAL | Status: DC
  Administered 2022-01-27: 325 mg via ORAL
  Filled 2022-01-26: qty 1

## 2022-01-26 MED ORDER — FOLIC ACID 1 MG PO TABS
1.0000 mg | ORAL_TABLET | Freq: Every day | ORAL | Status: DC
Start: 2022-01-26 — End: 2022-01-27
  Administered 2022-01-26 – 2022-01-27 (×2): 1 mg via ORAL
  Filled 2022-01-26 (×2): qty 1

## 2022-01-26 NOTE — Discharge Summary (Incomplete)
Physician Discharge Summary  Jonathan Nunez ZOX:[id number] DOB: [birth year] DOA: 01/25/2022  PCP: Pcp, No  Admit date: 01/25/2022 Discharge date: 01/27/2022 Recommendations for Outpatient Follow-up:  Follow up with PCP in 1 weeks for weekly B12 injection  Discharge Dispo: home Discharge Condition: Stable Code Status:   Code Status: Full Code Diet recommendation:  Diet Order             Diet regular Room service appropriate? Yes; Fluid consistency: Thin  Diet effective now                    Brief/Interim Summary: 35 y.o.mal with no known past medical history seen in the ED for fatigue lightheadedness anemia. Patient reports approximately 3 days of lightheadedness on standing, fatigue, and occasional palpitations.  He was evaluated for this at an urgent care 01/24/22  found to be anemic DRE and Hemoccult were normal.Patient reports having some dark brown stool a few days ago but none since, and he denies any hematochezia, abdominal pain, or vomiting.  [facility] ED Course: Upon arrival to the ED, patient is found to be afebrile and saturating well on room air with normal heart rate and stable blood pressure.  Labs shows microcytic anemia hemoglobin 6.9 g thrombocytopenia, severe B12 deficiency 1 unit P RBC ordered and admitted. B12 replacement daily while here along with iron and folic acid.  TOC consulted to ensure patient has appropriate follow-up lined up to get weekly B12 injection.  TOC was consulted and they were able to refer him to free clinic-who will call the patient I advised him to take oral vitamin B12 supplement until he is able to get B12 injection-also provided hematology and GI number for him to call for referral as he may need endoscopic evaluation down the line if he does not improve  Discharge Diagnoses:  Principal Problem:   Macrocytic anemia Active Problems:   Thrombocytopenia (HCC)  Symptomatic macrocytic anemia due to B12 deficiency Hb improved with 1 unit PRBC.B12 is  very low,B12 replacement done with inj IM daily along with iron and folic acid.  Patient denies any numbness tingling focal weakness or ataxia. TOC consulted to ensure patient has appropriate follow-up lined up to get weekly B12 injection.  TOC was consulted and they were able to refer him to free clinic I advised him to take oral vitamin B12 supplement high dose 1000 mcg daily until he is able to get B12 injection Also provided hematology and GI number for him to call for referral as he may need endoscopic evaluation given increased risk of gastrointestinal malignancy) anemia: he needs to see GI down the line  Recent Labs  Lab 01/24/22 1741 01/25/22 1732 01/26/22 0613 01/27/22 0543  HGB 7.1* 6.8* 8.0*  7.9* 8.1*  HCT 20.6* 19.6* 24.0*  23.7* 24.2*    Thrombocytopenia: Suspect from B12 deficiency.Monitor. Mild transaminitis in the setting of anemia.  Monitor, checked acute hepatitis panel   ADDENDUM: After his discharge I called him as his IF antibody came back positive -to let him know-that he needs to see GI as outpatient at some point due to increased risk of GI malignancy.  But Patient reported that he again felt lightheaded while taking shower-and he is returning to the ED ASAP to get checked out.  Discussed with sister-may be able to set him up for subcutaneous injection possibly for B12 for home as sister says she willing to try this after coming to ED. Possibly can learn in ED for injection use.  Consults: TOC  Subjective: Alert awake resting comfortably.  Feels well to go home today. Yesterday he agreed to stay overnight to discussed with TOC  Discharge Exam: Vitals:   01/26/22 2120 01/27/22 0619  BP: 120/76 127/71  Pulse: 69 64  Resp: 18 18  Temp: 98.5 F (36.9 C) 98 F (36.7 C)  SpO2: 99% 100%   General: Pt is alert, awake, not in acute distress Cardiovascular: RRR, S1/S2 +, no rubs, no gallops Respiratory: CTA bilaterally, no wheezing, no rhonchi Abdominal:  Soft, NT, ND, bowel sounds + Extremities: no edema, no cyanosis, sensation intact bilaterally in feet/LE and upper extremities.  Discharge Instructions  Discharge Instructions     Discharge instructions   Complete by: As directed    You will need to see your doctor weekly for B12 injection and follow-up on your labs  Please call call MD or return to ER for similar or worsening recurring problem that brought you to hospital or if any fever,nausea/vomiting,abdominal pain, uncontrolled pain, chest pain,  shortness of breath or any other alarming symptoms.  Please follow-up your doctor as instructed in a week time and call the office for appointment.  Please avoid alcohol, smoking, or any other illicit substance and maintain healthy habits including taking your regular medications as prescribed.  You were cared for by a hospitalist during your hospital stay. If you have any questions about your discharge medications or the care you received while you were in the hospital after you are discharged, you can call the unit and ask to speak with the hospitalist on call if the hospitalist that took care of you is not available.  Once you are discharged, your primary care physician will handle any further medical issues. Please note that NO REFILLS for any discharge medications will be authorized once you are discharged, as it is imperative that you return to your primary care physician (or establish a relationship with a primary care physician if you do not have one) for your aftercare needs so that they can reassess your need for medications and monitor your lab values   Increase activity slowly   Complete by: As directed       Allergies as of 01/27/2022   No Known Allergies      Medication List     TAKE these medications    ferrous sulfate 325 (65 FE) MG tablet Take 1 tablet (325 mg total) by mouth daily with breakfast. Start taking on: January 28, 2022   folic acid 1 MG  tablet Commonly known as: FOLVITE Take 1 tablet (1 mg total) by mouth daily. Start taking on: January 28, 2022   Kaopectate 262 MG Tabs Generic drug: Bismuth Subsalicylate Take 1 tablet by mouth daily as needed (diarrhea).   Multivitamin Adults Tabs Take 1 tablet by mouth daily.        No Known Allergies  The results of significant diagnostics from this hospitalization (including imaging, microbiology, ancillary and laboratory) are listed below for reference.    Microbiology: No results found for this or any previous visit (from the past 240 hour(s)).  Procedures/Studies: No results found.  Labs: BNP (last 3 results) No results for input(s): "BNP" in the last 8760 hours. Basic Metabolic Panel: Recent Labs  Lab 01/24/22 1741 01/25/22 1732 01/26/22 0020 01/27/22 0543  NA 137 136 140 140  K 4.4 3.9 3.7 3.9  CL 96* 100 104 105  CO2 28 28 28 30   GLUCOSE 93 102* 104* 107*  BUN 11 12  11 10  CREATININE 0.78 0.88 0.76 0.82  CALCIUM 9.7 8.9 9.1 9.5   Liver Function Tests: Recent Labs  Lab 01/24/22 1741 01/26/22 0020 01/26/22 0613 01/27/22 0543  AST 118* 98* 99* 88*  ALT 55* 53* 49* 52*  ALKPHOS 51 63 55 49  BILITOT 0.7 0.7 0.9 0.6  PROT 7.7 7.8 7.2 7.5  ALBUMIN 5.0 5.1* 4.5 4.4   No results for input(s): "LIPASE", "AMYLASE" in the last 168 hours. No results for input(s): "AMMONIA" in the last 168 hours. CBC: Recent Labs  Lab 01/24/22 1741 01/25/22 1732 01/26/22 0613 01/27/22 0543  WBC 8.1 7.1 7.5 8.6  HGB 7.1* 6.8* 8.0*  7.9* 8.1*  HCT 20.6* 19.6* 24.0*  23.7* 24.2*  MCV 107.3* 105.4* 107.1* 102.5*  PLT 123* 117* 104* 106*   Cardiac Enzymes: No results for input(s): "CKTOTAL", "CKMB", "CKMBINDEX", "TROPONINI" in the last 168 hours. BNP: Invalid input(s): "POCBNP" CBG: Recent Labs  Lab 01/24/22 1747  GLUCAP 99   D-Dimer No results for input(s): "DDIMER" in the last 72 hours. Hgb A1c No results for input(s): "HGBA1C" in the last 72  hours. Lipid Profile No results for input(s): "CHOL", "HDL", "LDLCALC", "TRIG", "CHOLHDL", "LDLDIRECT" in the last 72 hours. Thyroid function studies Recent Labs    01/24/22 1800  TSH 1.827   Anemia work up Recent Labs    01/25/22 1732 01/25/22 1905  VITAMINB12  --  73*  FOLATE  --  20.8  FERRITIN  --  81  TIBC  --  318  IRON  --  137  RETICCTPCT 1.6  --    Urinalysis    Component Value Date/Time   LABSPEC >=1.030 01/24/2022 1800   PHURINE 7.0 01/24/2022 1800   GLUCOSEU NEGATIVE 01/24/2022 1800   HGBUR NEGATIVE 01/24/2022 1800   BILIRUBINUR NEGATIVE 01/24/2022 1800   KETONESUR TRACE (A) 01/24/2022 1800   PROTEINUR >=300 (A) 01/24/2022 1800   UROBILINOGEN 1.0 01/24/2022 1800   NITRITE NEGATIVE 01/24/2022 1800   LEUKOCYTESUR NEGATIVE 01/24/2022 1800   Sepsis Labs Recent Labs  Lab 01/24/22 1741 01/25/22 1732 01/26/22 0613 01/27/22 0543  WBC 8.1 7.1 7.5 8.6   Microbiology No results found for this or any previous visit (from the past 240 hour(s)).  Time coordinating discharge: 25 minutes  SIGNED: Lanae Boast, MD  [facility] 01/27/2022, 11:44 AM  If 7PM-7AM, please contact night-coverage [email or web address]

## 2022-01-26 NOTE — Hospital Course (Addendum)
35 y.o.mal with no known past medical history seen in the ED for fatigue lightheadedness anemia. Patient reports approximately 3 days of lightheadedness on standing, fatigue, and occasional palpitations.  He was evaluated for this at an urgent care 01/24/22  found to be anemic DRE and Hemoccult were normal.Patient reports having some dark brown stool a few days ago but none since, and he denies any hematochezia, abdominal pain, or vomiting.  [facility] ED Course: Upon arrival to the ED, patient is found to be afebrile and saturating well on room air with normal heart rate and stable blood pressure.  Labs shows microcytic anemia hemoglobin 6.9 g thrombocytopenia, severe B12 deficiency 1 unit P RBC ordered and admitted. B12 replacement daily while here along with iron and folic acid.  TOC consulted to ensure patient has appropriate follow-up lined up to get weekly B12 injection.

## 2022-01-26 NOTE — Progress Notes (Signed)
PROGRESS NOTE Jonathan Nunez  RCV:[id number] DOB: Aug 31, 1985 DOA: 01/25/2022 PCP: Pcp, No   Brief Narrative/Hospital Course: 35 y.o.mal with no known past medical history seen in the ED for fatigue lightheadedness anemia. Patient reports approximately 3 days of lightheadedness on standing, fatigue, and occasional palpitations.  He was evaluated for this at an urgent care 01/24/22  found to be anemic DRE and Hemoccult were normal.Patient reports having some dark brown stool a few days ago but none since, and he denies any hematochezia, abdominal pain, or vomiting.  [facility] ED Course: Upon arrival to the ED, patient is found to be afebrile and saturating well on room air with normal heart rate and stable blood pressure.  Labs shows microcytic anemia hemoglobin 6.9 g thrombocytopenia, severe B12 deficiency 1 unit P RBC ordered and admitted.    Subjective: Seen examined this morning overall feeling much better.  No new complaints.   Assessment and Plan: Principal Problem:   Macrocytic anemia Active Problems:   Thrombocytopenia (HCC)   Symptomatic macrocytic anemia due to B12 deficiency Severe B12 deficiency: Hemoglobin improved with 1 unit PRBC.  B12 is very low starting high-dose B12 supplementation follow-up antibodies for intrinsic factor. Add folate supplement and oral iron.  Reports he had recently diarrhea for 2 to 3 weeks-sick contacts at home.  Feels better now.  If he has diarrhea send stool for testing.  TOC consulted to arrange outpatient PCP follow-up for weekly B12 injection which will happen tomorrow.  Thrombocytopenia: Suspect from B12 deficiency.  Monitor Mild transaminitis in the setting of anemia.  Monitor, check acute hepatitis panel  DVT prophylaxis: SCDs Start: 01/25/22 2257 Code Status:   Code Status: Full Code Family Communication: plan of care discussed with patient at bedside. Patient status is: Admitted as observation remains hospitalized for ongoing monitoring of  anemia Level of care: Med-Surg  Dispo: The patient is from: home.            Anticipated disposition: home once able to schedule outpatient follow-up and once hh is stable  Mobility Assessment (last 72 hours)     Mobility Assessment     Row Name 01/26/22 0000           Does patient have an order for bedrest or is patient medically unstable No - Continue assessment       What is the highest level of mobility based on the progressive mobility assessment? Level 6 (Walks independently in room and hall) - Balance while walking in room without assist - Complete                 Objective: Vitals last 24 hrs: Vitals:   01/26/22 0143 01/26/22 0408 01/26/22 0440 01/26/22 1440  BP: (!) 118/56 (!) 119/57 115/69 123/71  Pulse: 69 66 66 72  Resp: 15 15  15   Temp: 98.6 F (37 C) 98.7 F (37.1 C) 98.7 F (37.1 C) 98.4 F (36.9 C)  TempSrc: Oral Oral Oral Oral  SpO2: 98% 99% 100% 100%  Weight:      Height:       Weight change:   Physical Examination: General exam: alert awake,older than stated age, weak appearing. HEENT:Oral mucosa moist, Ear/Nose WNL grossly, dentition normal. Respiratory system: bilaterally diminished BS, no use of accessory muscle Cardiovascular system: S1 & S2 +, No JVD. Gastrointestinal system: Abdomen soft,NT,ND, BS+ Nervous System:Alert, awake, moving extremities and grossly nonfocal Extremities: LE edema neg,distal peripheral pulses palpable.  Skin: No rashes,no icterus. MSK: Normal muscle bulk,tone, power  Medications  reviewed:  Scheduled Meds:  cyanocobalamin  1,000 mcg Intramuscular Q0600   [START ON 01/27/2022] ferrous sulfate  325 mg Oral Q breakfast   folic acid  1 mg Oral Daily   Continuous Infusions:    Diet Order             Diet regular Room service appropriate? Yes; Fluid consistency: Thin  Diet effective now                            Intake/Output Summary (Last 24 hours) at 01/26/2022 1457 Last data filed at 01/26/2022  1100 Gross per 24 hour  Intake 885.88 ml  Output --  Net 885.88 ml   Net IO Since Admission: 885.88 mL [01/26/22 1457]  Wt Readings from Last 3 Encounters:  01/25/22 72.6 kg     Unresulted Labs (From admission, onward)     Start     Ordered   01/26/22 1400  CBC  Once,   R        01/26/22 1057   01/26/22 1059  Hepatitis panel, acute  Add-on,   AD        01/26/22 1058   01/26/22 0500  HIV Antibody (routine testing w rflx)  (HIV Antibody (Routine testing w reflex) panel)  Tomorrow morning,   R        01/25/22 2257   01/26/22 0500  Intrinsic Factor Antibodies  Tomorrow morning,   R        01/25/22 2303          Data Reviewed: I have personally reviewed following labs and imaging studies CBC: Recent Labs  Lab 01/24/22 1741 01/25/22 1732 01/26/22 0613  WBC 8.1 7.1  --   HGB 7.1* 6.8* 7.9*  HCT 20.6* 19.6* 23.7*  MCV 107.3* 105.4*  --   PLT 123* 117*  --    Basic Metabolic Panel: Recent Labs  Lab 01/24/22 1741 01/25/22 1732 01/26/22 0020  NA 137 136 140  K 4.4 3.9 3.7  CL 96* 100 104  CO2 28 28 28   GLUCOSE 93 102* 104*  BUN 11 12 11   CREATININE 0.78 0.88 0.76  CALCIUM 9.7 8.9 9.1   GFR: Estimated Creatinine Clearance: 127.1 mL/min (by C-G formula based on SCr of 0.76 mg/dL). Liver Function Tests: Recent Labs  Lab 01/24/22 1741 01/26/22 0020 01/26/22 0613  AST 118* 98* 99*  ALT 55* 53* 49*  ALKPHOS 51 63 55  BILITOT 0.7 0.7 0.9  PROT 7.7 7.8 7.2  ALBUMIN 5.0 5.1* 4.5   No results for input(s): "LIPASE", "AMYLASE" in the last 168 hours. No results for input(s): "AMMONIA" in the last 168 hours. Coagulation Profile: No results for input(s): "INR", "PROTIME" in the last 168 hours. BNP (last 3 results) No results for input(s): "PROBNP" in the last 8760 hours. HbA1C: No results for input(s): "HGBA1C" in the last 72 hours. CBG: Recent Labs  Lab 01/24/22 1747  GLUCAP 99   Lipid Profile: No results for input(s): "CHOL", "HDL", "LDLCALC", "TRIG",  "CHOLHDL", "LDLDIRECT" in the last 72 hours. Thyroid Function Tests: Recent Labs    01/24/22 1741 01/24/22 1800  TSH  --  1.827  FREET4 0.69  --    Sepsis Labs: No results for input(s): "PROCALCITON", "LATICACIDVEN" in the last 168 hours.  No results found for this or any previous visit (from the past 240 hour(s)).  Antimicrobials: Anti-infectives (From admission, onward)    None  Culture/Microbiology No results found for: "SDES", "SPECREQUEST", "CULT", "REPTSTATUS"  Other culture-see note  Radiology Studies: No results found.   LOS: 0 days   Lanae Boast, MD [facility]  01/26/2022, 2:57 PM

## 2022-01-27 ENCOUNTER — Encounter (HOSPITAL_COMMUNITY): Payer: Self-pay

## 2022-01-27 ENCOUNTER — Other Ambulatory Visit (HOSPITAL_COMMUNITY): Payer: Self-pay

## 2022-01-27 ENCOUNTER — Emergency Department (HOSPITAL_COMMUNITY)
Admission: EM | Admit: 2022-01-27 | Discharge: 2022-01-28 | Disposition: A | Discharge status: Home / Self Care | Payer: Self-pay | Attending: Emergency Medicine | Admitting: Emergency Medicine

## 2022-01-27 ENCOUNTER — Ambulatory Visit: Payer: Self-pay | Admitting: *Deleted

## 2022-01-27 ENCOUNTER — Other Ambulatory Visit: Payer: Self-pay

## 2022-01-27 DIAGNOSIS — D510 Vitamin B12 deficiency anemia due to intrinsic factor deficiency: Secondary | ICD-10-CM | POA: Insufficient documentation

## 2022-01-27 LAB — COMPREHENSIVE METABOLIC PANEL
ALT: 52 U/L — ABNORMAL HIGH (ref 0–44)
ALT: 55 U/L — ABNORMAL HIGH (ref 0–44)
AST: 88 U/L — ABNORMAL HIGH (ref 15–41)
AST: 69 U/L — ABNORMAL HIGH (ref 15–41)
Albumin: 4.4 g/dL (ref 3.5–5.0)
Albumin: 5.1 g/dL — ABNORMAL HIGH (ref 3.5–5.0)
Alkaline Phosphatase: 49 U/L (ref 38–126)
Alkaline Phosphatase: 53 U/L (ref 38–126)
Anion gap: 5 (ref 5–15)
Anion gap: 7 (ref 5–15)
BUN: 10 mg/dL (ref 6–20)
BUN: 11 mg/dL (ref 6–20)
CO2: 30 mmol/L (ref 22–32)
CO2: 28 mmol/L (ref 22–32)
Calcium: 9.5 mg/dL (ref 8.9–10.3)
Calcium: 9.8 mg/dL (ref 8.9–10.3)
Chloride: 105 mmol/L (ref 98–111)
Chloride: 103 mmol/L (ref 98–111)
Creatinine, Ser: 0.82 mg/dL (ref 0.61–1.24)
Creatinine, Ser: 0.81 mg/dL (ref 0.61–1.24)
GFR, Estimated: >60 mL/min (ref >60)
GFR, Estimated: >60 mL/min (ref >60)
Glucose, Bld: 107 mg/dL — ABNORMAL HIGH (ref 70–99)
Glucose, Bld: 128 mg/dL — ABNORMAL HIGH (ref 70–99)
Potassium: 3.9 mmol/L (ref 3.5–5.1)
Potassium: 4 mmol/L (ref 3.5–5.1)
Sodium: 140 mmol/L (ref 135–145)
Sodium: 138 mmol/L (ref 135–145)
Total Bilirubin: 0.6 mg/dL (ref 0.3–1.2)
Total Bilirubin: 0.6 mg/dL (ref 0.3–1.2)
Total Protein: 7.5 g/dL (ref 6.5–8.1)
Total Protein: 8.3 g/dL — ABNORMAL HIGH (ref 6.5–8.1)

## 2022-01-27 LAB — CBC WITH DIFFERENTIAL/PLATELET
Abs Immature Granulocytes: 0.25 10*3/uL — ABNORMAL HIGH (ref 0.00–0.07)
Basophils Absolute: 0 10*3/uL (ref 0.0–0.1)
Basophils Relative: 0 %
Eosinophils Absolute: 0.1 10*3/uL (ref 0.0–0.5)
Eosinophils Relative: 1 %
HCT: 25 % — ABNORMAL LOW (ref 39.0–52.0)
Hemoglobin: 8.4 g/dL — ABNORMAL LOW (ref 13.0–17.0)
Immature Granulocytes: 3 %
Lymphocytes Relative: 30 %
Lymphs Abs: 2.6 10*3/uL (ref 0.7–4.0)
MCH: 34.1 pg — ABNORMAL HIGH (ref 26.0–34.0)
MCHC: 33.6 g/dL (ref 30.0–36.0)
MCV: 101.6 fL — ABNORMAL HIGH (ref 80.0–100.0)
Monocytes Absolute: 0.7 10*3/uL (ref 0.1–1.0)
Monocytes Relative: 8 %
Neutro Abs: 4.9 10*3/uL (ref 1.7–7.7)
Neutrophils Relative %: 58 %
Platelets: 113 10*3/uL — ABNORMAL LOW (ref 150–400)
RBC: 2.46 MIL/uL — ABNORMAL LOW (ref 4.22–5.81)
RDW: 28.4 % — ABNORMAL HIGH (ref 11.5–15.5)
WBC: 8.5 10*3/uL (ref 4.0–10.5)
nRBC: 8.7 % — ABNORMAL HIGH (ref 0.0–0.2)

## 2022-01-27 LAB — TYPE AND SCREEN
ABO/RH(D): B POS
Antibody Screen: NEGATIVE
Unit division: 0

## 2022-01-27 LAB — MICROALBUMIN / CREATININE URINE RATIO
Creatinine, Urine: 291.4 mg/dL
Microalb Creat Ratio: 18 mg/g creat (ref 0–29)
Microalb, Ur: 53 ug/mL — ABNORMAL HIGH

## 2022-01-27 LAB — BPAM RBC
Blood Product Expiration Date: 202309252359
ISSUE DATE / TIME: 202309100122
Unit Type and Rh: 7300

## 2022-01-27 LAB — CBC
HCT: 24.2 % — ABNORMAL LOW (ref 39.0–52.0)
Hemoglobin: 8.1 g/dL — ABNORMAL LOW (ref 13.0–17.0)
MCH: 34.3 pg — ABNORMAL HIGH (ref 26.0–34.0)
MCHC: 33.5 g/dL (ref 30.0–36.0)
MCV: 102.5 fL — ABNORMAL HIGH (ref 80.0–100.0)
Platelets: 106 10*3/uL — ABNORMAL LOW (ref 150–400)
RBC: 2.36 MIL/uL — ABNORMAL LOW (ref 4.22–5.81)
RDW: 28.6 % — ABNORMAL HIGH (ref 11.5–15.5)
WBC: 8.6 10*3/uL (ref 4.0–10.5)
nRBC: 9.2 % — ABNORMAL HIGH (ref 0.0–0.2)

## 2022-01-27 LAB — PROTIME-INR
INR: 1.1 (ref 0.8–1.2)
Prothrombin Time: 13.8 seconds (ref 11.4–15.2)

## 2022-01-27 LAB — INTRINSIC FACTOR ANTIBODIES: Intrinsic Factor: 105.6 AU/mL — ABNORMAL HIGH (ref 0.0–1.1)

## 2022-01-27 LAB — POC OCCULT BLOOD, ED: Fecal Occult Bld: NEGATIVE

## 2022-01-27 LAB — HIV ANTIBODY (ROUTINE TESTING W REFLEX): HIV Screen 4th Generation wRfx: NONREACTIVE

## 2022-01-27 MED ORDER — FERROUS SULFATE 325 (65 FE) MG PO TABS: 325.0000 mg | ORAL_TABLET | Freq: Every day | ORAL | 0 refills | Status: DC

## 2022-01-27 MED ORDER — VITAMIN B-12 1000 MCG PO TABS
1000.0000 ug | ORAL_TABLET | Freq: Every day | ORAL | 0 refills | Status: DC
  Filled 2022-01-27: qty 30, 30d supply, fill #0

## 2022-01-27 MED ORDER — FOLIC ACID 1 MG PO TABS: 1.0000 mg | ORAL_TABLET | Freq: Every day | ORAL | 0 refills | Status: DC

## 2022-01-27 MED ORDER — FOLIC ACID 1 MG PO TABS
1.0000 mg | ORAL_TABLET | Freq: Every day | ORAL | 0 refills | Status: AC
  Filled 2022-01-27: qty 30, 30d supply, fill #0

## 2022-01-27 MED ORDER — VITAMIN B-12 1000 MCG PO TABS: 1000.0000 ug | ORAL_TABLET | Freq: Every day | ORAL | 1 refills | Status: AC

## 2022-01-27 MED ORDER — FERROUS SULFATE 325 (65 FE) MG PO TABS
325.0000 mg | ORAL_TABLET | Freq: Every day | ORAL | 0 refills | Status: DC
  Filled 2022-01-27: qty 30, 30d supply, fill #0

## 2022-01-27 NOTE — ED Provider Triage Note (Signed)
Emergency Medicine Provider Triage Evaluation Note  Jonathan Nunez , a 36 y.o. male  was evaluated in triage.  Pt complains of near syncope.  Discharged from the hospital a few hours ago and reports he was in the shower and became very lightheaded.  He felt as though he was in a pass out and his heart was beating out of his chest and he had to sit down.  Still feeling dizzy.  Per brief chart review patient was admitted to the hospital 2 days ago for symptomatic anemia.  Discharged earlier today, hemoglobin was 8.1 at discharge 5 hours ago. Review of Systems  Positive: Lightheadedness, dizziness and fatigue Negative: Dark stools  Physical Exam  BP 126/74 (BP Location: Right Arm)   Pulse 63   Temp 98.6 F (37 C) (Oral)   Resp 18   SpO2 98%  Gen:   Awake, no distress   Resp:  Normal effort  MSK:   Moves extremities without difficulty  Other:  Pale in appearance  Medical Decision Making  Medically screening exam initiated at 5:41 PM.  Appropriate orders placed.  Wanda Cellucci was informed that the remainder of the evaluation will be completed by another provider, this initial triage assessment does not replace that evaluation, and the importance of remaining in the ED until their evaluation is complete.     Saddie Benders, PA-C 01/27/22 1749

## 2022-01-27 NOTE — Telephone Encounter (Signed)
Patient currently in [facility] ED. Patient reports someone called him at home after being discharged today to come back immediately to ED for f/u. In review of chart , do not see documentation for patient to return to ED. Patient and sister Darrell Jewel whom patient verbalized allowing to know medical needs, is currently in ED and patient is experiencing dizziness and feels like passing out. Instructed patient's sister to go to front desk to report patient's symptoms and wait until patient can be assessed. Patient and sister verbalized understanding.

## 2022-01-27 NOTE — Progress Notes (Signed)
Patient being discharged home. PIV removed per orders. Discharge teaching completed and all questions answered.

## 2022-01-27 NOTE — ED Triage Notes (Signed)
Patient was discharged from the hospital today. Patient was called by the hospitalist today and was told to come to the ED for a Hgb-8.1.  Patient states he showered today and had a near syncope episode.

## 2022-01-27 NOTE — Plan of Care (Signed)

## 2022-01-28 LAB — VITAMIN B12: Vitamin B-12: 1499 pg/mL — ABNORMAL HIGH (ref 180–914)

## 2022-01-28 LAB — PREPARE RBC (CROSSMATCH)

## 2022-01-28 LAB — HCV INTERPRETATION

## 2022-01-28 LAB — PATHOLOGIST SMEAR REVIEW

## 2022-01-28 MED ORDER — CYANOCOBALAMIN 1000 MCG/ML IJ SOLN
1000.0000 ug | Freq: Once | INTRAMUSCULAR | Status: AC
  Administered 2022-01-28: 1000 ug via INTRAMUSCULAR
  Filled 2022-01-28: qty 1

## 2022-01-28 MED ORDER — SODIUM CHLORIDE 0.9 % IV SOLN: 10.0000 mL/h | Freq: Once | INTRAVENOUS | Status: DC

## 2022-01-28 NOTE — ED Notes (Signed)
Pt resting, respirations equal and unlabored, continuing to monitor.

## 2022-01-28 NOTE — Discharge Instructions (Addendum)
You will need regular injections of vitamin B12 to help build up your blood cells. Our case managers are working on that. You will also need to see the Gastroenterologist that you were referred to at discharge from the hospital.

## 2022-01-28 NOTE — ED Provider Notes (Signed)
[facility] DEPT Provider Note   CSN: [id number] Arrival date & time: 01/27/22  1705     History  Chief Complaint  Patient presents with   Abnormal Lab   Near Syncope    Jonathan Nunez is a 36 y.o. male.  Patient was instructed to come back to the ER for repeat evaluation.  He was hospitalized this past week for newfound anemia.  Patient was discharged this morning.  He reports that he was called at home and told to come back to the ED to be evaluated.  He reports that since discharge he has continued to have dizzy spells, felt weak like he was going to pass out.  This occurred while he was in the shower earlier.       Home Medications Prior to Admission medications   Medication Sig Start Date End Date Taking? Authorizing Provider  Bismuth Subsalicylate (KAOPECTATE) 262 MG TABS Take 1 tablet by mouth daily as needed (diarrhea).   Yes [provider]  ferrous sulfate 325 (65 FE) MG tablet Take 1 tablet (325 mg total) by mouth daily with breakfast. 01/28/22 02/27/22 Yes Kc, Maren Beach, MD  folic acid (FOLVITE) 1 MG tablet Take 1 tablet (1 mg total) by mouth daily. 01/28/22 02/27/22 Yes Antonieta Pert, MD  Multiple Vitamins-Minerals (MULTIVITAMIN ADULTS) TABS Take 1 tablet by mouth daily.   Yes [provider]  cyanocobalamin (VITAMIN B12) 1000 MCG tablet Take 1 tablet (1,000 mcg total) by mouth daily. Please take it until you are able to get B12 injection 01/27/22 03/28/22  Antonieta Pert, MD      Allergies    Patient has no known allergies.    Review of Systems   Review of Systems  Physical Exam Updated Vital Signs BP 125/83 (BP Location: Left Arm)   Pulse 67   Temp 98.3 F (36.8 C) (Oral)   Resp 16   Ht 5\' 8"  (1.727 m)   Wt 72.6 kg   SpO2 100%   BMI 24.33 kg/m  Physical Exam Vitals and nursing note reviewed.  Constitutional:      General: He is not in acute distress.    Appearance: He is well-developed.  HENT:     Head:  Normocephalic and atraumatic.     Mouth/Throat:     Mouth: Mucous membranes are moist.  Eyes:     General: Vision grossly intact. Gaze aligned appropriately.     Extraocular Movements: Extraocular movements intact.     Conjunctiva/sclera: Conjunctivae normal.  Cardiovascular:     Rate and Rhythm: Normal rate and regular rhythm.     Pulses: Normal pulses.     Heart sounds: Normal heart sounds, S1 normal and S2 normal. No murmur heard.    No friction rub. No gallop.  Pulmonary:     Effort: Pulmonary effort is normal. No respiratory distress.     Breath sounds: Normal breath sounds.  Abdominal:     Palpations: Abdomen is soft.     Tenderness: There is no abdominal tenderness. There is no guarding or rebound.     Hernia: No hernia is present.  Musculoskeletal:        General: No swelling.     Cervical back: Full passive range of motion without pain, normal range of motion and neck supple. No pain with movement, spinous process tenderness or muscular tenderness. Normal range of motion.     Right lower leg: No edema.     Left lower leg: No edema.  Skin:  General: Skin is warm and dry.     Capillary Refill: Capillary refill takes less than 2 seconds.     Findings: No ecchymosis, erythema, lesion or wound.  Neurological:     Mental Status: He is alert and oriented to person, place, and time.     GCS: GCS eye subscore is 4. GCS verbal subscore is 5. GCS motor subscore is 6.     Cranial Nerves: Cranial nerves 2-12 are intact.     Sensory: Sensation is intact.     Motor: Motor function is intact. No weakness or abnormal muscle tone.     Coordination: Coordination is intact.  Psychiatric:        Mood and Affect: Mood normal.        Speech: Speech normal.        Behavior: Behavior normal.     ED Results / Procedures / Treatments   Labs (all labs ordered are listed, but only abnormal results are displayed) Labs Reviewed  COMPREHENSIVE METABOLIC PANEL - Abnormal; Notable for the  following components:      Result Value   Glucose, Bld 128 (*)    Total Protein 8.3 (*)    Albumin 5.1 (*)    AST 69 (*)    ALT 55 (*)    All other components within normal limits  CBC WITH DIFFERENTIAL/PLATELET - Abnormal; Notable for the following components:   RBC 2.46 (*)    Hemoglobin 8.4 (*)    HCT 25.0 (*)    MCV 101.6 (*)    MCH 34.1 (*)    RDW 28.4 (*)    Platelets 113 (*)    nRBC 8.7 (*)    Abs Immature Granulocytes 0.25 (*)    All other components within normal limits  PROTIME-INR  VITAMIN B12  TYPE AND SCREEN  PREPARE RBC (CROSSMATCH)    EKG None  Radiology No results found.  Procedures Procedures    Medications Ordered in ED Medications  0.9 %  sodium chloride infusion (has no administration in time range)  cyanocobalamin (VITAMIN B12) injection 1,000 mcg (1,000 mcg Intramuscular Given 01/28/22 0220)    ED Course/ Medical Decision Making/ A&P Clinical Course as of 01/28/22 0231  Mon Jan 27, 2022  1758 Called by PCP (Dr Corey Harold) about pt. Transfused on 9/8. Had symptomatic pernicious anemia and was given B12 injection which he was taking at home. Feels pt should be evaluated by GI at some point. Had near syncope event today. May need B12 injection today.  [RP]    Clinical Course User Index [RP] Rondel Baton, MD                           Medical Decision Making Amount and/or Complexity of Data Reviewed Labs: ordered.  Risk Prescription drug management.   Patient presents to the emergency department at the request of hospitalist service.  Patient was called at home to tell him that his intrinsic factor antibody was positive, explaining his pernicious anemia.  He reported that he was once again feeling dizzy and fell like he was going to pass out in the shower so was instructed to come back to the emergency department.  Here in the emergency department, patient has normal vital signs.  He is afebrile, normal blood pressure, normal oxygen  saturations.  No arrhythmia noted.  Repeat blood work reveals his hemoglobin is at the same level as at time of discharge, no change.  Patient expressing frustration that he is continuing to have symptoms.  He feels as though his symptoms and his disease has not been addressed.  I attempted to explain to him that this will likely be a chronic problem that requires a specific treatment that is being arranged for him as an outpatient.  He reports that he does not feel well enough to go home.  I do not feel that repeat admission will be of benefit to the patient.  Because of his symptoms have been identified.  Discussed briefly with hospitalist on-call, recommends blood transfusion and vitamin B12 injection.    He will require ongoing B12 injections to improve his anemia.  As he is experiencing symptoms, will transfuse 1 unit packed red blood cells to improve his hemoglobin and he can be discharged, continue outpatient work-up and treatment as initiated by the hospitalist service.        Final Clinical Impression(s) / ED Diagnoses Final diagnoses:  Pernicious anemia    Rx / DC Orders ED Discharge Orders     None         Shyla Gayheart, Canary Brim, MD 01/28/22 0231

## 2022-01-28 NOTE — ED Notes (Signed)
Blood consent obtained

## 2022-01-28 NOTE — TOC Progression Note (Signed)
Transition of Care [facility]) - Progression Note    Patient Details  Name: Jonathan Nunez MRN: [id number] Date of Birth: 21-Oct-1985  Transition of Care [facility]) CM/SW Contact  Golda Acre, RN Phone Number: 01/28/2022, 10:35 AM  Clinical Narrative:    No case manager saw patient while in the ed.  [facility]-jenna-procare resource done for patient to get medications.  Procare letter emailed to [employer].  Telephone number to the [facility] and wellness given to jenna to call for the earlier appointment for a pcp.  None were available on [id number].  See notes from that date.        Expected Discharge Plan and Services           Expected Discharge Date: 01/27/22                                     Social Determinants of Health (SDOH) Interventions    Readmission Risk Interventions   No data to display

## 2022-01-29 ENCOUNTER — Other Ambulatory Visit (HOSPITAL_COMMUNITY): Payer: Self-pay

## 2022-01-29 LAB — TYPE AND SCREEN
ABO/RH(D): B POS
Antibody Screen: NEGATIVE
Unit division: 0

## 2022-01-29 LAB — BPAM RBC
Blood Product Expiration Date: 202309252359
ISSUE DATE / TIME: 202309120258
Unit Type and Rh: 7300

## 2022-02-01 LAB — HEPATITIS PANEL, ACUTE

## 2022-02-13 ENCOUNTER — Encounter (INDEPENDENT_AMBULATORY_CARE_PROVIDER_SITE_OTHER): Payer: Self-pay | Admitting: Primary Care

## 2022-02-13 ENCOUNTER — Ambulatory Visit: Payer: Self-pay | Attending: Primary Care

## 2022-02-13 ENCOUNTER — Ambulatory Visit (INDEPENDENT_AMBULATORY_CARE_PROVIDER_SITE_OTHER): Payer: Self-pay | Admitting: Primary Care

## 2022-02-13 VITALS — BP 118/76 | HR 71 | Resp 16 | Ht 69.0 in | Wt 166.4 lb

## 2022-02-13 DIAGNOSIS — Z09 Encounter for follow-up examination after completed treatment for conditions other than malignant neoplasm: Secondary | ICD-10-CM

## 2022-02-13 DIAGNOSIS — E538 Deficiency of other specified B group vitamins: Secondary | ICD-10-CM

## 2022-02-13 DIAGNOSIS — D510 Vitamin B12 deficiency anemia due to intrinsic factor deficiency: Secondary | ICD-10-CM

## 2022-02-13 DIAGNOSIS — Z7689 Persons encountering health services in other specified circumstances: Secondary | ICD-10-CM

## 2022-02-13 DIAGNOSIS — D696 Thrombocytopenia, unspecified: Secondary | ICD-10-CM

## 2022-02-13 MED ORDER — CYANOCOBALAMIN 1000 MCG/ML IJ SOLN
1000.0000 ug | Freq: Once | INTRAMUSCULAR | Status: AC
  Administered 2022-02-13: 1000 ug via INTRAMUSCULAR

## 2022-02-14 NOTE — Progress Notes (Signed)
[facility]   Subjective:  [name] is a 36 y.o. male presents for hospital follow up and establish care. Admit date to the hospital was 01/27/22, patient was discharged from the hospital on 01/28/22, patient was admitted for: Pernicious anemia.Patient presents to the emergency department at the request of hospitalist service.  Patient was called at home to tell him that his intrinsic factor antibody was positive, explaining his pernicious anemia.  He reported that he was still having same s/s of feeling dizzy and fell like he was going to pass out .  Today he has a flat mood not very responsive to answers his girlfriend is present and speaks for him.  She states is always like this.  She starts questioning about pernicious anemia and thrombocytopenia.  The thrombocytopenia was discussed due to reading hospital encounters and diagnosis.  Tried to explain that 1 affected other and he would need to be on B12 at this time.  Consulted with oncologist/hematologist recommended further follow-up with ordering new labs and he will most likely be on B12 the rest of his life. Patient has No headache, No chest pain, No abdominal pain - No Nausea, No new weakness, generalized fatigue, no tingling or numbness, No Cough - shortness of breath   No past medical history on file.   No Known Allergies    Current Outpatient Medications on File Prior to Visit  Medication Sig Dispense Refill   Bismuth Subsalicylate (KAOPECTATE) 262 MG TABS Take 1 tablet by mouth daily as needed (diarrhea).     cyanocobalamin (VITAMIN B12) 1000 MCG tablet Take 1 tablet (1,000 mcg total) by mouth daily. Please take it until you are able to get B12 injection 30 tablet 1   ferrous sulfate 325 (65 FE) MG tablet Take 1 tablet (325 mg total) by mouth daily with breakfast. 30 tablet 0   folic acid (FOLVITE) 1 MG tablet Take 1 tablet (1 mg total) by mouth daily. 30 tablet 0   Multiple Vitamins-Minerals (MULTIVITAMIN  ADULTS) TABS Take 1 tablet by mouth daily.     No current facility-administered medications on file prior to visit.     Review of System: Comprehensive ROS Pertinent positive and negative noted in HPI    Objective:  BP 118/76   Pulse 71   Resp 16   Ht 5\' 9"  (1.753 m)   Wt 166 lb 6.4 oz (75.5 kg)   SpO2 99%   BMI 24.57 kg/m   Filed Weights   02/13/22 1509  Weight: 166 lb 6.4 oz (75.5 kg)    Physical Exam: General Appearance: Well nourished, in no apparent distress.ill appearing Eyes: PERRLA, EOMs, conjunctiva no swelling or erythema Sinuses: No Frontal/maxillary tenderness ENT/Mouth: Ext aud canals clear, TMs without erythema, bulging. Hearing normal.  Neck: Supple, thyroid normal.  Respiratory: Respiratory effort normal, BS equal bilaterally without rales, rhonchi, wheezing or stridor.  Cardio: RRR with no MRGs. Brisk peripheral pulses without edema.  Abdomen: Soft, + BS.  Non tender, no guarding, rebound, hernias, masses. Lymphatics: Non tender without lymphadenopathy.  Musculoskeletal: Full ROM, 5/5 strength, normal gait.  Skin: Warm, dry without rashes, lesions, ecchymosis.  Neuro: Cranial nerves intact. Normal muscle tone, no cerebellar symptoms. Sensation intact.  Psych: Awake and oriented X 3, normal affect, Insight and Judgment appropriate.    Assessment:  [name] was seen today for hospitalization follow-up.  Diagnoses and all orders for this visit:  Thrombocytopenia [facility]) -     Ambulatory referral to Hematology / Oncology -  Ambulatory referral to Gastroenterology  Pernicious anemia -     Ambulatory referral to Hematology / Oncology -     Ambulatory referral to Gastroenterology -     Vitamin B12  Hospital discharge follow-up Follow up with Malachy Mood, MD (Hematology) in 1 week (02/03/2022); CALL OFFICE FOR FOLLOW UP REGARDING ANEMIA- not done Sent to [facility] for B12 injection  Encounter to establish care Establish care with  PCP    This note has been created with Dragon speech recognition software and smart Lobby[employer]. Any transcriptional errors are unintentional.   Grayce Sessions, NP 02/14/2022, 11:10 PM

## 2022-02-20 ENCOUNTER — Ambulatory Visit (INDEPENDENT_AMBULATORY_CARE_PROVIDER_SITE_OTHER): Payer: Self-pay

## 2022-02-20 DIAGNOSIS — E538 Deficiency of other specified B group vitamins: Secondary | ICD-10-CM

## 2022-02-20 MED ORDER — CYANOCOBALAMIN 1000 MCG/ML IJ SOLN
1000.0000 ug | Freq: Once | INTRAMUSCULAR | Status: AC
  Administered 2022-02-20: 1000 ug via INTRAMUSCULAR

## 2022-02-20 NOTE — Patient Instructions (Signed)
Pernicious Anemia  Pernicious anemia is a condition where your body cannot absorb enough vitamin B12 from your digestive tract. Without vitamin B12, your body is not able to make the red blood cells that you need to carry oxygen in your body. Normally, you can get enough vitamin B12 from eating foods such as meat, poultry, eggs, and dairy products. If you have pernicious anemia, you do not absorb enough vitamin B12 from your diet, and anemia develops over time. When you have anemia, your organs may not work properly, and you may feel very tired. Untreated pernicious anemia can lead to severe symptoms of anemia, including chest pain, heart failure, and permanent nervous system damage. What are the causes? Pernicious anemia is believed to be an autoimmune disease. When you have an autoimmune disease, your body's defense system (immune system) mistakenly attacks normal cells in your body. Normally, your stomach makes a protein called intrinsic factor (IF). This protein helps your body absorb vitamin B12 in your intestines. When your stomach does not make enough IF, your intestines cannot absorb enough vitamin B12. Your stomach may not make enough IF because your immune system attacks the IF or the cells in the stomach that make it. What increases the risk? You are more likely to develop this condition if you: Are older than 34 years of age. Have a family history of pernicious anemia. Are of Northern European or Scandinavian descent. Have another type of autoimmune disease. What are the signs or symptoms? Many times there are no symptoms of this condition. Pernicious anemia symptoms may take many years to develop. Symptoms may include: Tiredness. Light-headedness. A sore tongue or a burning sensation on the tongue. A smooth red tongue. Stomach problems such as heartburn, diarrhea, or constipation. Shortness of breath, especially when walking or exercising. Signs of long-term damage to the nervous  system include: Being unsteady while walking. Tingling or numbness of hands and feet. Problems concentrating, confusion, or irritability. Mental health concerns, such as depression, hallucinations, or false beliefs (delusions). Loss of smell. How is this diagnosed? In many cases, anemia may be found after you have a routine blood test. This condition may also be diagnosed based on: Your medical history and symptoms you are having. A physical exam. Blood tests. A procedure to look at your stomach called an endoscopy. How is this treated? This condition may be treated with vitamin B12 replacement. This may include: Injections of vitamin B12. This is the most common treatment. Using a spray that you breathe in through your nose (nasal spray). A vitamin B12 nasal spray may be used to treat people who are not able to swallow supplements. Taking a pill by mouth that contains a large dose of vitamin B12. Long-term monitoring and regular visits to a health care provider. If the condition is found and treatment is started in the early stages, most people do not develop complications. Treatment reverses the condition and prevents future anemia, but it must be continued for life. Having pernicious anemia also puts you at higher risk for stomach cancer. Follow these instructions at home: Take over-the-counter and prescription medicines only as told by your health care provider. Return to your normal activities as told by your health care provider. Ask your health care provider what activities are safe for you. Eat a healthy diet that includes plenty of vegetables, fruits, whole grains, low-fat dairy products, and lean protein. Keep all follow-up visits. You will need to have your blood level of vitamin B12 checked regularly. Contact a   health care provider if: You develop new symptoms. Your symptoms return or get worse after treatment. You have stomach pain. You have symptoms of an infection, such as  a fever. You have trouble swallowing. Get help right away if: You have chest pain. You have a rapid or abnormal heartbeat. You have dizziness or you faint. You have trouble breathing. You have pain, swelling, or redness in an arm or leg. You vomit blood. These symptoms may be an emergency. Get help right away. Call 911. Do not wait to see if the symptoms will go away. Do not drive yourself to the hospital. Summary Pernicious anemia happens when your body cannot make enough red blood cells because it cannot absorb enough vitamin B12. This condition is usually caused by an autoimmune disease. Symptoms may take years to develop. Treatment of this condition includes vitamin B12 replacement for life. Contact your health care provider if you develop new symptoms or if your symptoms return or get worse. Keep all follow-up visits for treatment and monitoring of your condition. This information is not intended to replace advice given to you by your health care provider. Make sure you discuss any questions you have with your health care provider. Document Revised: 08/26/2021 Document Reviewed: 08/26/2021 Elsevier Patient Education  2023 [employer] Inc.  

## 2022-02-21 ENCOUNTER — Telehealth: Payer: Self-pay | Admitting: Physician Assistant

## 2022-02-21 NOTE — Telephone Encounter (Signed)
Scheduled appt per 10/5 referral. Pt is aware of appt date and time. Pt is aware to arrive 15 mins prior to appt time and to bring and updated insurance card. Pt is aware of appt location.   

## 2022-02-27 ENCOUNTER — Ambulatory Visit (INDEPENDENT_AMBULATORY_CARE_PROVIDER_SITE_OTHER): Payer: Self-pay

## 2022-02-27 DIAGNOSIS — E538 Deficiency of other specified B group vitamins: Secondary | ICD-10-CM

## 2022-02-27 MED ORDER — CYANOCOBALAMIN 1000 MCG/ML IJ SOLN
1000.0000 ug | Freq: Once | INTRAMUSCULAR | Status: AC
  Administered 2022-02-27: 1000 ug via INTRAMUSCULAR

## 2022-03-04 ENCOUNTER — Ambulatory Visit (INDEPENDENT_AMBULATORY_CARE_PROVIDER_SITE_OTHER): Payer: Self-pay

## 2022-03-04 ENCOUNTER — Telehealth (INDEPENDENT_AMBULATORY_CARE_PROVIDER_SITE_OTHER): Payer: Self-pay

## 2022-03-04 DIAGNOSIS — E538 Deficiency of other specified B group vitamins: Secondary | ICD-10-CM

## 2022-03-04 MED ORDER — CYANOCOBALAMIN 1000 MCG/ML IJ SOLN
1000.0000 ug | Freq: Once | INTRAMUSCULAR | Status: AC
  Administered 2022-03-04: 1000 ug via INTRAMUSCULAR

## 2022-03-04 NOTE — Progress Notes (Signed)
Pt here for B-12 injection Pt was given injection in left deltoid muscle.

## 2022-03-04 NOTE — Telephone Encounter (Signed)
Pt is going on vacation and wants to know how much alcohol he is able to drink.

## 2022-03-06 NOTE — Telephone Encounter (Signed)
Noted  
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
## 2022-03-10 ENCOUNTER — Other Ambulatory Visit: Payer: Self-pay

## 2022-03-10 DIAGNOSIS — E538 Deficiency of other specified B group vitamins: Secondary | ICD-10-CM

## 2022-03-11 ENCOUNTER — Other Ambulatory Visit: Payer: Self-pay | Admitting: Physician Assistant

## 2022-03-11 ENCOUNTER — Encounter: Payer: Self-pay | Admitting: Physician Assistant

## 2022-03-11 ENCOUNTER — Inpatient Hospital Stay: Payer: Self-pay | Attending: Physician Assistant | Admitting: Physician Assistant

## 2022-03-11 ENCOUNTER — Ambulatory Visit: Payer: Self-pay

## 2022-03-11 ENCOUNTER — Inpatient Hospital Stay: Payer: Self-pay

## 2022-03-11 ENCOUNTER — Other Ambulatory Visit: Payer: Self-pay

## 2022-03-11 VITALS — BP 115/71 | HR 66 | Temp 98.2°F | Wt 164.7 lb

## 2022-03-11 DIAGNOSIS — D696 Thrombocytopenia, unspecified: Secondary | ICD-10-CM | POA: Insufficient documentation

## 2022-03-11 DIAGNOSIS — R55 Syncope and collapse: Secondary | ICD-10-CM | POA: Insufficient documentation

## 2022-03-11 DIAGNOSIS — E538 Deficiency of other specified B group vitamins: Secondary | ICD-10-CM

## 2022-03-11 DIAGNOSIS — R002 Palpitations: Secondary | ICD-10-CM | POA: Insufficient documentation

## 2022-03-11 DIAGNOSIS — D510 Vitamin B12 deficiency anemia due to intrinsic factor deficiency: Secondary | ICD-10-CM | POA: Insufficient documentation

## 2022-03-11 DIAGNOSIS — R7401 Elevation of levels of liver transaminase levels: Secondary | ICD-10-CM | POA: Insufficient documentation

## 2022-03-11 LAB — TSH: TSH: 0.743 u[IU]/mL (ref 0.350–4.500)

## 2022-03-11 LAB — IRON AND IRON BINDING CAPACITY (CC-WL,HP ONLY)
Iron: 213 ug/dL — ABNORMAL HIGH (ref 45–182)
Saturation Ratios: 66 % — ABNORMAL HIGH (ref 17.9–39.5)
TIBC: 325 ug/dL (ref 250–450)
UIBC: 112 ug/dL — ABNORMAL LOW (ref 117–376)

## 2022-03-11 LAB — CBC WITH DIFFERENTIAL (CANCER CENTER ONLY)
Abs Immature Granulocytes: 0.02 10*3/uL (ref 0.00–0.07)
Basophils Absolute: 0.1 10*3/uL (ref 0.0–0.1)
Basophils Relative: 1 %
Eosinophils Absolute: 0.2 10*3/uL (ref 0.0–0.5)
Eosinophils Relative: 3 %
HCT: 41.9 % (ref 39.0–52.0)
Hemoglobin: 13.5 g/dL (ref 13.0–17.0)
Immature Granulocytes: 0 %
Lymphocytes Relative: 25 %
Lymphs Abs: 1.9 10*3/uL (ref 0.7–4.0)
MCH: 29.1 pg (ref 26.0–34.0)
MCHC: 32.2 g/dL (ref 30.0–36.0)
MCV: 90.3 fL (ref 80.0–100.0)
Monocytes Absolute: 0.6 10*3/uL (ref 0.1–1.0)
Monocytes Relative: 8 %
Neutro Abs: 4.8 10*3/uL (ref 1.7–7.7)
Neutrophils Relative %: 63 %
Platelet Count: 428 10*3/uL — ABNORMAL HIGH (ref 150–400)
RBC: 4.64 MIL/uL (ref 4.22–5.81)
RDW: 14.6 % (ref 11.5–15.5)
WBC Count: 7.6 10*3/uL (ref 4.0–10.5)
nRBC: 0 % (ref 0.0–0.2)

## 2022-03-11 LAB — CMP (CANCER CENTER ONLY)
ALT: 11 U/L (ref 0–44)
AST: 15 U/L (ref 15–41)
Albumin: 4.5 g/dL (ref 3.5–5.0)
Alkaline Phosphatase: 77 U/L (ref 38–126)
Anion gap: 3 — ABNORMAL LOW (ref 5–15)
BUN: 9 mg/dL (ref 6–20)
CO2: 32 mmol/L (ref 22–32)
Calcium: 9 mg/dL (ref 8.9–10.3)
Chloride: 104 mmol/L (ref 98–111)
Creatinine: 0.82 mg/dL (ref 0.61–1.24)
GFR, Estimated: >60 mL/min (ref >60)
Glucose, Bld: 102 mg/dL — ABNORMAL HIGH (ref 70–99)
Potassium: 4.3 mmol/L (ref 3.5–5.1)
Sodium: 139 mmol/L (ref 135–145)
Total Bilirubin: 0.3 mg/dL (ref 0.3–1.2)
Total Protein: 7.5 g/dL (ref 6.5–8.1)

## 2022-03-11 LAB — VITAMIN B12: Vitamin B-12: 1107 pg/mL — ABNORMAL HIGH (ref 180–914)

## 2022-03-11 LAB — FERRITIN: Ferritin: 14 ng/mL — ABNORMAL LOW (ref 24–336)

## 2022-03-11 LAB — FOLATE: Folate: >40 ng/mL (ref >5.9)

## 2022-03-13 ENCOUNTER — Ambulatory Visit (INDEPENDENT_AMBULATORY_CARE_PROVIDER_SITE_OTHER): Payer: Self-pay

## 2022-03-13 DIAGNOSIS — E538 Deficiency of other specified B group vitamins: Secondary | ICD-10-CM

## 2022-03-13 MED ORDER — CYANOCOBALAMIN 1000 MCG/ML IJ SOLN
1000.0000 ug | Freq: Once | INTRAMUSCULAR | Status: AC
  Administered 2022-04-09: 1000 ug via INTRAMUSCULAR

## 2022-03-20 ENCOUNTER — Ambulatory Visit (INDEPENDENT_AMBULATORY_CARE_PROVIDER_SITE_OTHER): Payer: Self-pay

## 2022-03-25 ENCOUNTER — Ambulatory Visit (INDEPENDENT_AMBULATORY_CARE_PROVIDER_SITE_OTHER): Payer: Self-pay

## 2022-03-25 DIAGNOSIS — E538 Deficiency of other specified B group vitamins: Secondary | ICD-10-CM

## 2022-03-25 MED ORDER — CYANOCOBALAMIN 1000 MCG/ML IJ SOLN
1000.0000 ug | Freq: Once | INTRAMUSCULAR | Status: AC
  Administered 2022-03-25: 1000 ug via INTRAMUSCULAR

## 2022-03-27 ENCOUNTER — Ambulatory Visit (INDEPENDENT_AMBULATORY_CARE_PROVIDER_SITE_OTHER): Payer: Self-pay

## 2022-04-08 ENCOUNTER — Ambulatory Visit (INDEPENDENT_AMBULATORY_CARE_PROVIDER_SITE_OTHER): Payer: Self-pay

## 2022-04-09 ENCOUNTER — Ambulatory Visit (INDEPENDENT_AMBULATORY_CARE_PROVIDER_SITE_OTHER): Payer: Self-pay

## 2022-04-09 DIAGNOSIS — E538 Deficiency of other specified B group vitamins: Secondary | ICD-10-CM

## 2022-04-23 ENCOUNTER — Ambulatory Visit (INDEPENDENT_AMBULATORY_CARE_PROVIDER_SITE_OTHER): Payer: Self-pay

## 2022-04-24 ENCOUNTER — Ambulatory Visit (INDEPENDENT_AMBULATORY_CARE_PROVIDER_SITE_OTHER): Payer: Self-pay

## 2022-04-24 DIAGNOSIS — E538 Deficiency of other specified B group vitamins: Secondary | ICD-10-CM

## 2022-04-24 MED ORDER — CYANOCOBALAMIN 1000 MCG/ML IJ SOLN
1000.0000 ug | Freq: Once | INTRAMUSCULAR | Status: AC
  Administered 2022-04-24: 1000 ug via INTRAMUSCULAR

## 2022-05-08 ENCOUNTER — Encounter: Payer: Self-pay | Admitting: Physician Assistant

## 2022-05-08 ENCOUNTER — Ambulatory Visit (INDEPENDENT_AMBULATORY_CARE_PROVIDER_SITE_OTHER): Payer: Self-pay

## 2022-05-08 DIAGNOSIS — E538 Deficiency of other specified B group vitamins: Secondary | ICD-10-CM

## 2022-05-08 MED ORDER — CYANOCOBALAMIN 1000 MCG/ML IJ SOLN
1000.0000 ug | Freq: Once | INTRAMUSCULAR | Status: AC
  Administered 2022-05-08: 1000 ug via INTRAMUSCULAR

## 2022-05-08 NOTE — Progress Notes (Signed)
Patient was given a B-12 injection in his right deltoid. Patient tolerated well.

## 2022-05-21 ENCOUNTER — Encounter (INDEPENDENT_AMBULATORY_CARE_PROVIDER_SITE_OTHER): Payer: Self-pay | Admitting: *Deleted

## 2022-05-22 ENCOUNTER — Ambulatory Visit (INDEPENDENT_AMBULATORY_CARE_PROVIDER_SITE_OTHER): Payer: Self-pay

## 2022-05-23 ENCOUNTER — Ambulatory Visit: Payer: Self-pay

## 2022-05-26 ENCOUNTER — Ambulatory Visit: Payer: Self-pay | Attending: Family Medicine | Admitting: *Deleted

## 2022-05-26 DIAGNOSIS — E538 Deficiency of other specified B group vitamins: Secondary | ICD-10-CM

## 2022-05-26 MED ORDER — CYANOCOBALAMIN 1000 MCG/ML IJ SOLN
1000.0000 ug | Freq: Once | INTRAMUSCULAR | Status: AC
  Administered 2022-05-26: 1000 ug via INTRAMUSCULAR

## 2022-05-26 NOTE — Progress Notes (Signed)
Patient needs labs for B12 injection.   Patient here today for injection.  Cyanocobalamin injection given today in left deltoid.  Site unremarkable & patient tolerated injection.  Patient needs to follow up for labs before next injection is given. Appointment made for 06/06/2022 as a nurse visit at  4 pm. Carilyn Goodpasture, RN,BSN

## 2022-06-04 NOTE — Progress Notes (Incomplete)
06/04/2022 Jonathan Nunez [id number] 1985-12-28  Referring provider: [name], NP Primary GI doctor: {acdocs:27040}  ASSESSMENT AND PLAN:   There are no diagnoses linked to this encounter.   Patient Care Team: [name], NP as PCP - General (Internal Medicine)  HISTORY OF PRESENT ILLNESS: 37 y.o. male with a past medical history of ***and others listed below presents for evaluation of pernicious anemia.  Patient follows with oncology for pernicious anemia and thrombocytopenia 01/24/22 urgent care for dizziness fatigue and decreased appetite showed new onset significant macrocytic anemia with a Hbg of 7.1 and MCV of 107.3, normal WBC of 8.1, and thrombocytopenia with a plt count of 123k. Denied history of PUD or significant NSAID use. He had stool cards performed which were negative for blood.  He had some elevated AST at 118 and ALT of 55.  01/27/2022 ER with worsening symptoms showed Hgb 6.8, platelets 117, reticulocyte normal immature reticulocyte fraction and reticulocyte absolute, B12 73, iron studies normal iron 137, normal TIBC 318, elevated saturation 43 and ferritin 81, folate 20 Intrinsic factor antibody elevated 105.6, HIV and acute hepatitis panel negative Status post 1 unit PRBC  Started on B12 injections. 03/11/2022 3 repeat Hgb 13.5, MCV 90, platelets 428, normal CMP, liver studies better, elevated iron and saturation patient instructed to stop iron supplements.  He  reports that he has quit smoking. His smoking use included cigarettes. He has never used smokeless tobacco. He reports current alcohol use. He reports current drug use. Drug: Marijuana.  Current Medications:       Current Outpatient Medications (Hematological):    ferrous sulfate 325 (65 FE) MG tablet, Take 1 tablet (325 mg total) by mouth daily with breakfast.  Current Outpatient Medications (Other):    Bismuth Subsalicylate (KAOPECTATE) 262 MG TABS, Take 1 tablet by mouth  daily as needed (diarrhea).   Multiple Vitamins-Minerals (MULTIVITAMIN ADULTS) TABS, Take 1 tablet by mouth daily.  Medical History: No past medical history on file. Allergies: No Known Allergies   Surgical History:  He  has a past surgical history that includes Hip fracture surgery and Pelvic fracture surgery. Family History:  His family history is not on file.  REVIEW OF SYSTEMS  : All other systems reviewed and negative except where noted in the History of Present Illness.  PHYSICAL EXAM: There were no vitals taken for this visit. General:   Pleasant, well developed male in no acute distress Head:   Normocephalic and atraumatic. Eyes:  sclerae anicteric,conjunctive pink  Heart:   {HEART EXAM HEM/ONC:21750} Pulm:  Clear anteriorly; no wheezing Abdomen:   {BlankSingle:19197::"Distended","Ridged","Soft"}, {BlankSingle:19197::"Flat","Obese","Non-distended"} AB, {BlankSingle:19197::"Absent","Hyperactive, tinkling","Hypoactive","Sluggish","Active"} bowel sounds. {actendernessAB:27319} tenderness {anatomy; site abdomen:5010}. {BlankMultiple:19196::"Without guarding","With guarding","Without rebound","With rebound"}, No organomegaly appreciated. Rectal: {acrectalexam:27461} Extremities:  {With/Without:304960234} edema. Msk: Symmetrical without gross deformities. Peripheral pulses intact.  Neurologic:  Alert and  oriented x4;  No focal deficits.  Skin:   Dry and intact without significant lesions or rashes. Psychiatric:  Cooperative. Normal mood and affect.  RELEVANT LABS AND IMAGING: CBC    Component Value Date/Time   WBC 7.6 03/11/2022 1346   WBC 8.5 01/27/2022 1825   RBC 4.64 03/11/2022 1346   HGB 13.5 03/11/2022 1346   HCT 41.9 03/11/2022 1346   PLT 428 (H) 03/11/2022 1346   MCV 90.3 03/11/2022 1346   MCH 29.1 03/11/2022 1346   MCHC 32.2 03/11/2022 1346   RDW 14.6 03/11/2022 1346   LYMPHSABS 1.9 03/11/2022 1346   MONOABS 0.6 03/11/2022 1346   EOSABS  0.2 03/11/2022 1346    BASOSABS 0.1 03/11/2022 1346    CMP     Component Value Date/Time   NA 139 03/11/2022 1346   K 4.3 03/11/2022 1346   CL 104 03/11/2022 1346   CO2 32 03/11/2022 1346   GLUCOSE 102 (H) 03/11/2022 1346   BUN 9 03/11/2022 1346   CREATININE 0.82 03/11/2022 1346   CALCIUM 9.0 03/11/2022 1346   PROT 7.5 03/11/2022 1346   ALBUMIN 4.5 03/11/2022 1346   AST 15 03/11/2022 1346   ALT 11 03/11/2022 1346   ALKPHOS 77 03/11/2022 1346   BILITOT 0.3 03/11/2022 1346   GFRNONAA >60 03/11/2022 Jonathan Cailee Blanke, PA-C 1:29 PM

## 2022-06-05 ENCOUNTER — Ambulatory Visit: Payer: Self-pay | Admitting: Physician Assistant

## 2022-06-09 ENCOUNTER — Ambulatory Visit (INDEPENDENT_AMBULATORY_CARE_PROVIDER_SITE_OTHER): Payer: Self-pay

## 2022-06-10 ENCOUNTER — Ambulatory Visit (INDEPENDENT_AMBULATORY_CARE_PROVIDER_SITE_OTHER): Payer: Self-pay

## 2022-08-28 ENCOUNTER — Telehealth: Payer: Self-pay | Admitting: Primary Care

## 2022-08-28 NOTE — Telephone Encounter (Signed)
Copied from CRM [id number]. Topic: Appointment Scheduling - Scheduling Inquiry for Clinic >> Aug 28, 2022 11:37 AM Franchot Heidelberg wrote: Reason for CRM: Pt called reporting that he needs to be scheduled for his B12 injection, he feels that he is due

## 2022-09-01 NOTE — Telephone Encounter (Signed)
Does pt still needs vot b12 injection

## 2022-09-02 ENCOUNTER — Other Ambulatory Visit (INDEPENDENT_AMBULATORY_CARE_PROVIDER_SITE_OTHER): Payer: Self-pay

## 2022-09-02 DIAGNOSIS — E538 Deficiency of other specified B group vitamins: Secondary | ICD-10-CM

## 2022-09-03 LAB — VITAMIN B12: Vitamin B-12: >2000 pg/mL — ABNORMAL HIGH (ref 232–1245)

## 2022-09-03 NOTE — Telephone Encounter (Signed)
Returned pt call and went over provider response pt doesn't have any questions or concerns  

## 2022-09-03 NOTE — Telephone Encounter (Signed)
Pt is calling back, requesting an update. Stated he would like to schedule his B12 injection.  Please advise.

## 2022-09-30 ENCOUNTER — Telehealth (INDEPENDENT_AMBULATORY_CARE_PROVIDER_SITE_OTHER): Payer: Self-pay | Admitting: Primary Care

## 2022-09-30 NOTE — Telephone Encounter (Signed)
The patient called in stating he has questions about previous lab results from April. He specifically was talking about his B12. Please assist patient further.

## 2022-09-30 NOTE — Telephone Encounter (Signed)
Called, spoke with patient about elevated vitamin B12 lab results. Patient states that he has been taking B12 gummy vitamins OTC because at times he starts to feel fatigue and feels like his heart is beating to rapidly. Patient states he has not be contacted by oncology to discuss labs. Patient also wanted to know of side effects from elevated B12 results.  Please advise patient, [phone].

## 2022-09-30 NOTE — Telephone Encounter (Signed)
Will forward to provider  

## 2022-10-02 NOTE — Telephone Encounter (Signed)
Contacted pt to go over provider response. Pt didn't answer lvm making pt aware but also made him aware that he does have a lab appt schedule for 7/1 and if he has any questions or concerns to give [place] a call

## 2022-11-17 ENCOUNTER — Other Ambulatory Visit (INDEPENDENT_AMBULATORY_CARE_PROVIDER_SITE_OTHER): Payer: Self-pay

## 2022-11-18 ENCOUNTER — Other Ambulatory Visit (INDEPENDENT_AMBULATORY_CARE_PROVIDER_SITE_OTHER): Payer: Self-pay

## 2022-11-18 DIAGNOSIS — E538 Deficiency of other specified B group vitamins: Secondary | ICD-10-CM

## 2022-11-19 LAB — VITAMIN B12: Vitamin B-12: 752 pg/mL (ref 232–1245)

## 2022-11-26 ENCOUNTER — Telehealth (INDEPENDENT_AMBULATORY_CARE_PROVIDER_SITE_OTHER): Payer: Self-pay

## 2022-11-26 NOTE — Telephone Encounter (Signed)
Copied from CRM [id number]. Topic: General - Other >> Nov 26, 2022  3:52 PM Macon Large wrote: Reason for CRM: Pt called for ab results. Pt requests call back. Cb# [phone]

## 2022-11-26 NOTE — Telephone Encounter (Signed)
Contacted pt to go over lab results pt is aware and doesn't have any questions or concerns 

## 2023-06-15 ENCOUNTER — Telehealth: Payer: Self-pay | Admitting: *Deleted

## 2023-06-15 ENCOUNTER — Encounter (INDEPENDENT_AMBULATORY_CARE_PROVIDER_SITE_OTHER): Payer: Self-pay | Admitting: Primary Care

## 2023-06-15 ENCOUNTER — Ambulatory Visit (INDEPENDENT_AMBULATORY_CARE_PROVIDER_SITE_OTHER): Payer: Self-pay | Admitting: Primary Care

## 2023-06-15 VITALS — BP 104/71 | HR 64 | Resp 16 | Ht 69.0 in | Wt 173.0 lb

## 2023-06-15 DIAGNOSIS — E538 Deficiency of other specified B group vitamins: Secondary | ICD-10-CM

## 2023-06-15 DIAGNOSIS — Z5941 Food insecurity: Secondary | ICD-10-CM

## 2023-06-15 DIAGNOSIS — Z2821 Immunization not carried out because of patient refusal: Secondary | ICD-10-CM

## 2023-06-15 DIAGNOSIS — Z5912 Inadequate housing utilities: Secondary | ICD-10-CM

## 2023-06-15 DIAGNOSIS — D539 Nutritional anemia, unspecified: Secondary | ICD-10-CM

## 2023-06-15 NOTE — Progress Notes (Signed)
Complex Care Management Note  Care Guide Note 06/15/2023 Name: Jonathan Nunez MRN: [id number] DOB: [birth year]  [name] is a 38 y.o. year old male who sees Grayce Sessions, NP for primary care. I reached out to Lynelle Smoke by phone today to offer complex care management services.  [name] was given information about Complex Care Management services today including:   The Complex Care Management services include support from the care team which includes your Nurse Coordinator, Clinical Social Worker, or Pharmacist.  The Complex Care Management team is here to help remove barriers to the health concerns and goals most important to you. Complex Care Management services are voluntary, and the patient may decline or stop services at any time by request to their care team member.   Complex Care Management Consent Status: Patient agreed to services and verbal consent obtained.   Follow up plan:  Telephone appointment with complex care management team member scheduled for:  06/22/23  Encounter Outcome:  Patient Scheduled  SDOH Interventions Today    Flowsheet Row Most Recent Value  SDOH Interventions   Food Insecurity Interventions Intervention Not Indicated  [Patient stated that selected wrong answer on screening question]       Gwenevere Ghazi  [facility] Guide  Direct Dial: [phone]  Fax [phone]

## 2023-06-15 NOTE — Progress Notes (Signed)
[facility]  Jonathan Nunez, is a 38 y.o. male  [id number]  [id number]  DOB - 05/18/1986  Chief Complaint  Patient presents with   Fatigue       Subjective:   Jonathan Nunez is a 38 y.o. male here today for an acute visit. Only concern is increase fatigue and notice black stool-denies constipation lasted a couple of days resolved.  HPI  No problems updated.  Comprehensive ROS Pertinent positive and negative noted in HPI   No Known Allergies  History reviewed. No pertinent past medical history.  Current Outpatient Medications on File Prior to Visit  Medication Sig Dispense Refill   Bismuth Subsalicylate (KAOPECTATE) 262 MG TABS Take 1 tablet by mouth daily as needed (diarrhea).     Multiple Vitamins-Minerals (MULTIVITAMIN ADULTS) TABS Take 1 tablet by mouth daily.     No current facility-administered medications on file prior to visit.   Health Maintenance  Topic Date Due   COVID-19 Vaccine (1 - 2024-25 season) Never done   Flu Shot  08/17/2023*   DTaP/Tdap/Td vaccine (1 - Tdap) 06/14/2024*   Hepatitis C Screening  Completed   HIV Screening  Completed   HPV Vaccine  Aged Out  *Topic was postponed. The date shown is not the original due date.    Objective:   Vitals:   06/15/23 1116  BP: 104/71  Pulse: 64  Resp: 16  SpO2: 100%  Weight: 173 lb (78.5 kg)  Height: 5\' 9"  (1.753 m)   BP Readings from Last 3 Encounters:  06/15/23 104/71  03/11/22 115/71  02/13/22 118/76      Physical Exam Vitals reviewed.  Constitutional:      Appearance: Normal appearance.  HENT:     Head: Normocephalic.     Right Ear: Tympanic membrane and external ear normal.     Left Ear: Tympanic membrane and external ear normal.  Eyes:     Extraocular Movements: Extraocular movements intact.     Pupils: Pupils are equal, round, and reactive to light.  Cardiovascular:     Rate and Rhythm: Normal rate and regular rhythm.  Pulmonary:     Effort: Pulmonary  effort is normal.     Breath sounds: Normal breath sounds.  Abdominal:     General: Abdomen is flat. Bowel sounds are normal.     Palpations: Abdomen is soft.  Musculoskeletal:        General: Normal range of motion.     Cervical back: Normal range of motion.  Skin:    General: Skin is warm and dry.  Neurological:     Mental Status: He is alert and oriented to person, place, and time.  Psychiatric:        Mood and Affect: Mood normal.        Behavior: Behavior normal.        Thought Content: Thought content normal.        Judgment: Judgment normal.       Assessment & Plan  Jonathan Nunez was seen today for fatigue.  Diagnoses and all orders for this visit:  Influenza vaccination declined  Tetanus, diphtheria, and acellular pertussis (Tdap) vaccination declined  Food insecurity -     AMB Referral VBCI Care Management  Inadequate housing utilities -     AMB Referral VBCI Care Management  B12 deficiency -     Vitamin B12 -     CMP14+EGFR  Macrocytic anemia -     CBC with Differential/Platelet -     Iron,  TIBC and Ferritin Panel -     CMP14+EGFR      Patient have been counseled extensively about nutrition and exercise. Other issues discussed during this visit include: low cholesterol diet, weight control and daily exercise, foot care, annual eye examinations at Ophthalmology, importance of adherence with medications and regular follow-up. We also discussed long term complications of uncontrolled diabetes and hypertension.   To be determine on lab results  The patient was given clear instructions to go to ER or return to medical center if symptoms don't improve, worsen or new problems develop. The patient verbalized understanding. The patient was told to call to get lab results if they haven't heard anything in the next week.   This note has been created with Education officer, environmental. Any transcriptional errors are unintentional.    Grayce Sessions, NP 06/15/2023, 11:37 AM

## 2023-06-16 ENCOUNTER — Encounter (INDEPENDENT_AMBULATORY_CARE_PROVIDER_SITE_OTHER): Payer: Self-pay | Admitting: Primary Care

## 2023-06-16 LAB — IRON,TIBC AND FERRITIN PANEL
Ferritin: 59 ng/mL (ref 30–400)
Iron Saturation: 32 % (ref 15–55)
Iron: 87 ug/dL (ref 38–169)
Total Iron Binding Capacity: 273 ug/dL (ref 250–450)
UIBC: 186 ug/dL (ref 111–343)

## 2023-06-16 LAB — CMP14+EGFR
ALT: 15 [IU]/L (ref 0–44)
AST: 21 [IU]/L (ref 0–40)
Albumin: 4.6 g/dL (ref 4.1–5.1)
Alkaline Phosphatase: 102 [IU]/L (ref 44–121)
BUN/Creatinine Ratio: 13 (ref 9–20)
BUN: 11 mg/dL (ref 6–20)
Bilirubin Total: 0.2 mg/dL (ref 0.0–1.2)
CO2: 26 mmol/L (ref 20–29)
Calcium: 9.1 mg/dL (ref 8.7–10.2)
Chloride: 99 mmol/L (ref 96–106)
Creatinine, Ser: 0.83 mg/dL (ref 0.76–1.27)
Globulin, Total: 2.4 g/dL (ref 1.5–4.5)
Glucose: 84 mg/dL (ref 70–99)
Potassium: 5.3 mmol/L — ABNORMAL HIGH (ref 3.5–5.2)
Sodium: 139 mmol/L (ref 134–144)
Total Protein: 7 g/dL (ref 6.0–8.5)
eGFR: 117 mL/min/{1.73_m2} (ref >59)

## 2023-06-16 LAB — CBC WITH DIFFERENTIAL/PLATELET
Basophils Absolute: 0.1 10*3/uL (ref 0.0–0.2)
Basos: 1 %
EOS (ABSOLUTE): 0.2 10*3/uL (ref 0.0–0.4)
Eos: 4 %
Hematocrit: 46 % (ref 37.5–51.0)
Hemoglobin: 14.4 g/dL (ref 13.0–17.7)
Immature Grans (Abs): 0 10*3/uL (ref 0.0–0.1)
Immature Granulocytes: 0 %
Lymphocytes Absolute: 1.9 10*3/uL (ref 0.7–3.1)
Lymphs: 30 %
MCH: 27.9 pg (ref 26.6–33.0)
MCHC: 31.3 g/dL — ABNORMAL LOW (ref 31.5–35.7)
MCV: 89 fL (ref 79–97)
Monocytes Absolute: 0.6 10*3/uL (ref 0.1–0.9)
Monocytes: 10 %
Neutrophils Absolute: 3.4 10*3/uL (ref 1.4–7.0)
Neutrophils: 55 %
Platelets: 409 10*3/uL (ref 150–450)
RBC: 5.17 x10E6/uL (ref 4.14–5.80)
RDW: 12.1 % (ref 11.6–15.4)
WBC: 6.2 10*3/uL (ref 3.4–10.8)

## 2023-06-16 LAB — VITAMIN B12: Vitamin B-12: 782 pg/mL (ref 232–1245)

## 2023-06-22 ENCOUNTER — Ambulatory Visit: Payer: Self-pay

## 2023-06-22 NOTE — Patient Outreach (Signed)
  Care Coordination   06/22/2023 Name: Jonathan Nunez MRN: [id number] DOB: Aug 24, 1985   Care Coordination Outreach Attempts:  An unsuccessful outreach was attempted for an appointment today.  Follow Up Plan:  Additional outreach attempts will be made to offer the patient complex care management information and services.   Encounter Outcome:  No Answer   Care Coordination Interventions:  No, not indicated    Lysle Morales, BSW [place]  [facility], [facility] Social Worker Direct Dial: [phone]  Fax: [phone] Website: [email or web address]

## 2023-07-14 ENCOUNTER — Ambulatory Visit: Payer: Self-pay | Admitting: Licensed Clinical Social Worker

## 2023-07-14 NOTE — Patient Instructions (Signed)
 Visit Information  Thank you for taking time to visit with me today. Please don't hesitate to contact me if I can be of assistance to you.   Following are the goals we discussed today:   Goals Addressed             This Visit's Progress    Care Coordination Activities       .Care Coordination Interventions: Patient stated that he lives with 2 other people and they all split the bills and that he works about 25 hours per week and runs low on food, he does not get SNAP benefits, SW will mail out the food [employer] and educated the patient on the GGFF ( Greater Dietitian)  SW will follow up 07/31/2023 at 2:00 pm        Our next appointment is by telephone on 07/31/2023 at 2:00 pm  Please call the care guide team at [phone] if you need to cancel or reschedule your appointment.   If you are experiencing a Mental Health or Behavioral Health Crisis or need someone to talk to, please call the Suicide and Crisis Lifeline: 988 go to [facility] [address] [place] [phone]) call 911  Patient verbalizes understanding of instructions and care plan provided today and agrees to view in MyChart. Active MyChart status and patient understanding of how to access instructions and care plan via MyChart confirmed with patient.     Jeanie Cooks, PhD [facility], [facility] Social Worker Direct Dial: [phone]  Fax: [phone]

## 2023-07-14 NOTE — Patient Outreach (Signed)
 Care Coordination   Initial Visit Note   07/14/2023 Name: Jonathan Nunez MRN: [id number] DOB: 1986-05-17  Jonathan Nunez is a 38 y.o. year old male who sees Grayce Sessions, NP for primary care. I spoke with  Lynelle Smoke by phone today.  What matters to the patients health and wellness today?  Food Insecurities     Goals Addressed             This Visit's Progress    Care Coordination Activities       .Care Coordination Interventions: Patient stated that he lives with 2 other people and they all split the bills and that he works about 25 hours per week and runs low on food, he does not get SNAP benefits, SW will mail out the food [employer] and educated the patient on the GGFF ( Greater Dietitian)  SW will follow up 07/31/2023 at 2:00 pm        SDOH assessments and interventions completed:  Yes  SDOH Interventions Today    Flowsheet Row Most Recent Value  SDOH Interventions   Food Insecurity Interventions Community Resources Provided, Other (Comment)  [Sw will mail food pantry list and educated patient on GGFF]  Housing Interventions Intervention Not Indicated  Transportation Interventions Intervention Not Indicated  Utilities Interventions Intervention Not Indicated        Care Coordination Interventions:  Yes, provided   Follow up plan: Follow up call scheduled for 07/31/2023 at 2:00 pm    Encounter Outcome:  Patient Visit Completed   Jeanie Cooks, PhD [facility], [facility], [facility], [facility] Social Worker Direct Dial: [phone]  Fax: [phone]

## 2023-07-31 ENCOUNTER — Ambulatory Visit: Payer: Self-pay | Admitting: Licensed Clinical Social Worker

## 2023-07-31 NOTE — Patient Outreach (Signed)
 Care Coordination   07/31/2023 Name: Jonathan Nunez MRN: [id number] DOB: 09-17-1985   Care Coordination Outreach Attempts:  An unsuccessful outreach was attempted for an appointment today.  Follow Up Plan:  Additional outreach attempts will be made to offer the patient complex care management information and services.   Encounter Outcome:  No Answer   Care Coordination Interventions:  No, not indicated    Jeanie Cooks, PhD [facility], [facility] Social Worker Direct Dial: [phone]  Fax: [phone]

## 2023-08-19 ENCOUNTER — Encounter (INDEPENDENT_AMBULATORY_CARE_PROVIDER_SITE_OTHER): Payer: Self-pay | Admitting: Primary Care

## 2023-08-19 ENCOUNTER — Ambulatory Visit (INDEPENDENT_AMBULATORY_CARE_PROVIDER_SITE_OTHER): Payer: Self-pay | Admitting: Primary Care

## 2023-08-19 VITALS — BP 125/78 | HR 65 | Temp 98.2°F | Wt 165.0 lb

## 2023-08-19 DIAGNOSIS — E538 Deficiency of other specified B group vitamins: Secondary | ICD-10-CM

## 2023-08-19 DIAGNOSIS — K529 Noninfective gastroenteritis and colitis, unspecified: Secondary | ICD-10-CM

## 2023-08-19 NOTE — Progress Notes (Signed)
 [facility]  Jonathan Nunez, is a 38 y.o. male  [id number]  [id number]  DOB - [birth year]  Chief Complaint  Patient presents with   Diarrhea    Patient states it has been happening for two   Emesis       Subjective:   Jonathan Nunez is a 38 y.o. male here today for an acute visit.  He has been having episodes of diarrhea for the last 2 days and up to 5-6 loose stools a day and has nausea and vomiting with out blood in either stool or emesis to 2- 3.  He also has had chills but not able to take temperature and no appetite.  He attempted to eat a biscuit today and it did not stay down.  Advised to eat soup and a brat diet.  Let the symptoms run its course probably viral.  If he continues to have nausea and vomiting any and diarrhea for the next 3 -4 days and unable to keep liquids down he would need to proceed to urgent care.  He also recalls the last time this happened his B12 was low.  No problems updated.  Comprehensive ROS Pertinent positive and negative noted in HPI   No Known Allergies  History reviewed. No pertinent past medical history.  Current Outpatient Medications on File Prior to Visit  Medication Sig Dispense Refill   Bismuth Subsalicylate (KAOPECTATE) 262 MG TABS Take 1 tablet by mouth daily as needed (diarrhea). (Patient not taking: Reported on 08/19/2023)     Multiple Vitamins-Minerals (MULTIVITAMIN ADULTS) TABS Take 1 tablet by mouth daily. (Patient not taking: Reported on 08/19/2023)     No current facility-administered medications on file prior to visit.   Health Maintenance  Topic Date Due   COVID-19 Vaccine (1 - 2024-25 season) Never done   DTaP/Tdap/Td vaccine (1 - Tdap) 06/14/2024*   Flu Shot  12/18/2023   Hepatitis C Screening  Completed   HIV Screening  Completed   HPV Vaccine  Aged Out  *Topic was postponed. The date shown is not the original due date.    Objective:   Vitals:   08/19/23 1408  BP: 125/78  Pulse: 65  Temp:  98.2 F (36.8 C)  TempSrc: Oral  SpO2: 97%  Weight: 165 lb (74.8 kg)   Physical exam: General: Vital signs reviewed.  Patient is well-developed and well-nourished, in no acute distress and cooperative with exam. Head: Normocephalic and atraumatic. Eyes: EOMI, conjunctivae normal, no scleral icterus. Neck: Supple, trachea midline, normal ROM, no JVD, masses, thyromegaly, or carotid bruit present. Cardiovascular: RRR, S1 normal, S2 normal, no murmurs, gallops, or rubs. Pulmonary/Chest: Clear to auscultation bilaterally, no wheezes, rales, or rhonchi. Abdominal: Soft, non-tender, non-distended, BS +, no masses, organomegaly, or guarding present. Musculoskeletal: No joint deformities, erythema, or stiffness, ROM full and nontender. Extremities: No lower extremity edema bilaterally,  pulses symmetric and intact bilaterally. No cyanosis or clubbing. Neurological: A&O x3, Strength is normal Skin: Warm, dry and intact. No rashes or erythema. Psychiatric: Normal mood and affect. speech and behavior is normal. Cognition and memory are normal.    Assessment & Plan  Jonathan Nunez was seen today for diarrhea and emesis.  Diagnoses and all orders for this visit:  B12 deficiency -     Vitamin B12  Gastroenteritis -     CBC with Differential     Patient have been counseled extensively about nutrition and exercise. Other issues discussed during this visit include: low cholesterol diet, weight control and daily  exercise, foot care, annual eye examinations at Ophthalmology, importance of adherence with medications and regular follow-up. We also discussed long term complications of uncontrolled diabetes and hypertension.   Return if symptoms worsen or fail to improve.  The patient was given clear instructions to go to ER or return to medical center if symptoms don't improve, worsen or new problems develop. The patient verbalized understanding. The patient was told to call to get lab results if they haven't  heard anything in the next week.   This note has been created with Education officer, environmental. Any transcriptional errors are unintentional.   Grayce Sessions, NP 08/24/2023, 1:24 PM

## 2023-08-20 LAB — CBC WITH DIFFERENTIAL/PLATELET
Basophils Absolute: 0 10*3/uL (ref 0.0–0.2)
Basos: 1 %
EOS (ABSOLUTE): 0 10*3/uL (ref 0.0–0.4)
Eos: 1 %
Hematocrit: 49.9 % (ref 37.5–51.0)
Hemoglobin: 16 g/dL (ref 13.0–17.7)
Immature Grans (Abs): 0 10*3/uL (ref 0.0–0.1)
Immature Granulocytes: 0 %
Lymphocytes Absolute: 1.8 10*3/uL (ref 0.7–3.1)
Lymphs: 25 %
MCH: 28.3 pg (ref 26.6–33.0)
MCHC: 32.1 g/dL (ref 31.5–35.7)
MCV: 88 fL (ref 79–97)
Monocytes Absolute: 0.9 10*3/uL (ref 0.1–0.9)
Monocytes: 13 %
Neutrophils Absolute: 4.5 10*3/uL (ref 1.4–7.0)
Neutrophils: 60 %
Platelets: 411 10*3/uL (ref 150–450)
RBC: 5.65 x10E6/uL (ref 4.14–5.80)
RDW: 12 % (ref 11.6–15.4)
WBC: 7.3 10*3/uL (ref 3.4–10.8)

## 2023-08-20 LAB — VITAMIN B12: Vitamin B-12: 707 pg/mL (ref 232–1245)

## 2023-08-23 ENCOUNTER — Encounter (INDEPENDENT_AMBULATORY_CARE_PROVIDER_SITE_OTHER): Payer: Self-pay | Admitting: Primary Care

## 2023-12-12 IMAGING — DX DG TOE GREAT 2+V*L*
3 series · 3 of 3 positions shown · non-contrast
Comparison: None.

CLINICAL DATA: Left first toe pain for 1-2 weeks. No reported
injury.

EXAM:
LEFT GREAT TOE

[toe ap]
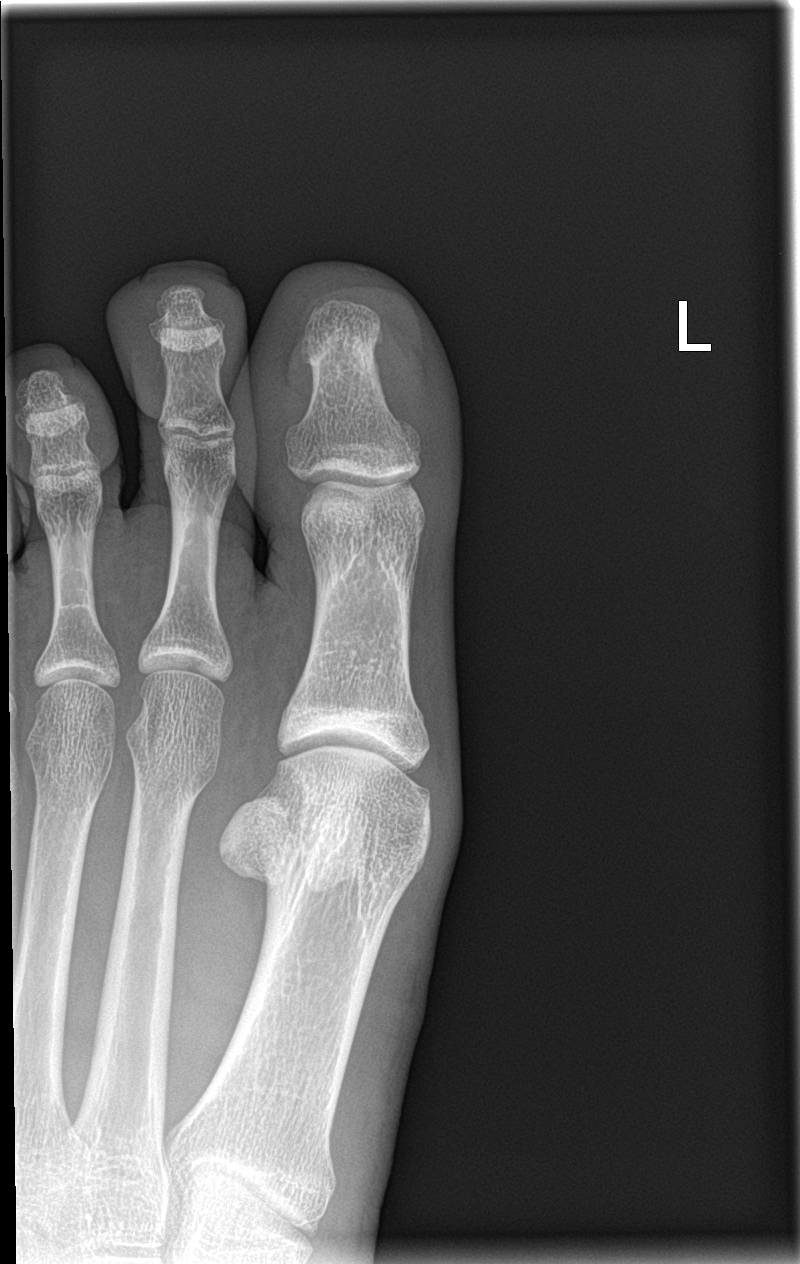

[toe obl]
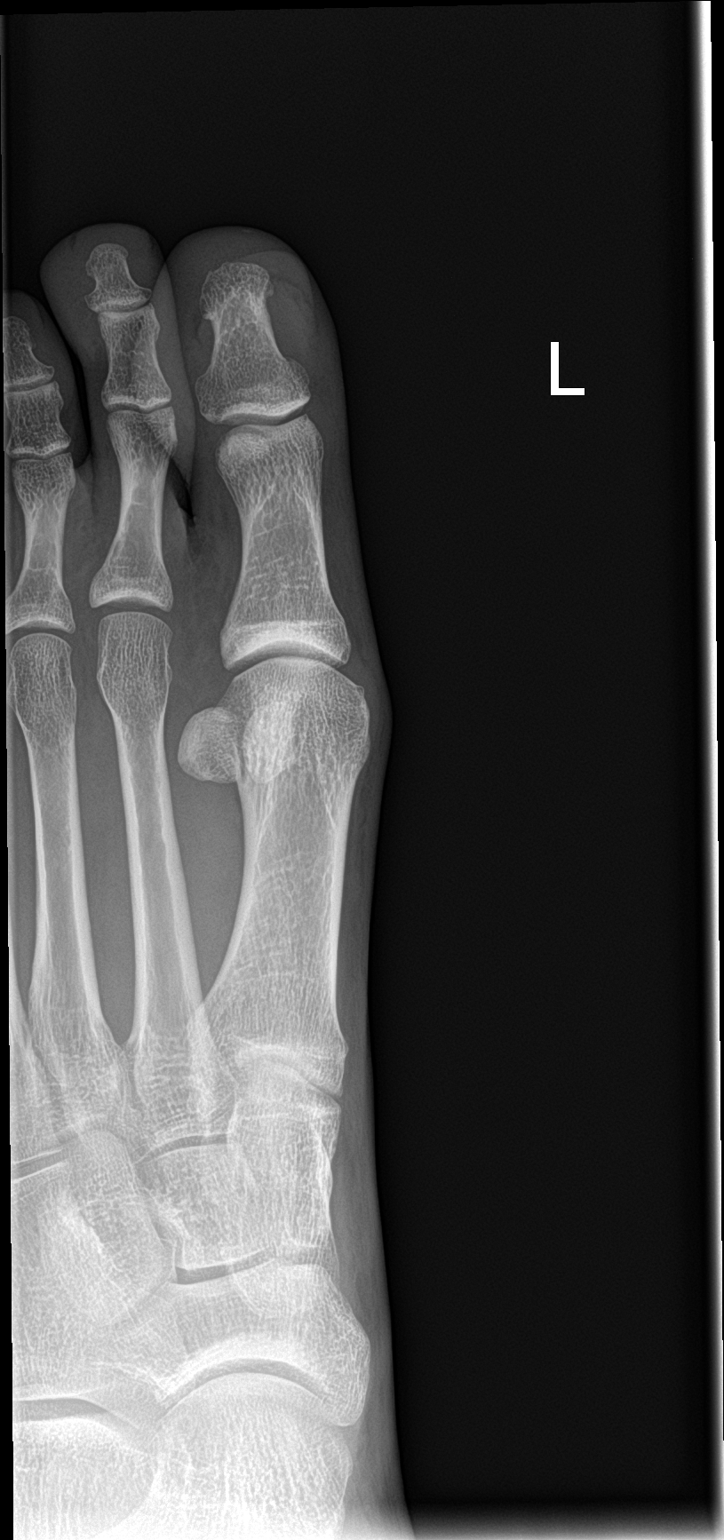

[toe lat]
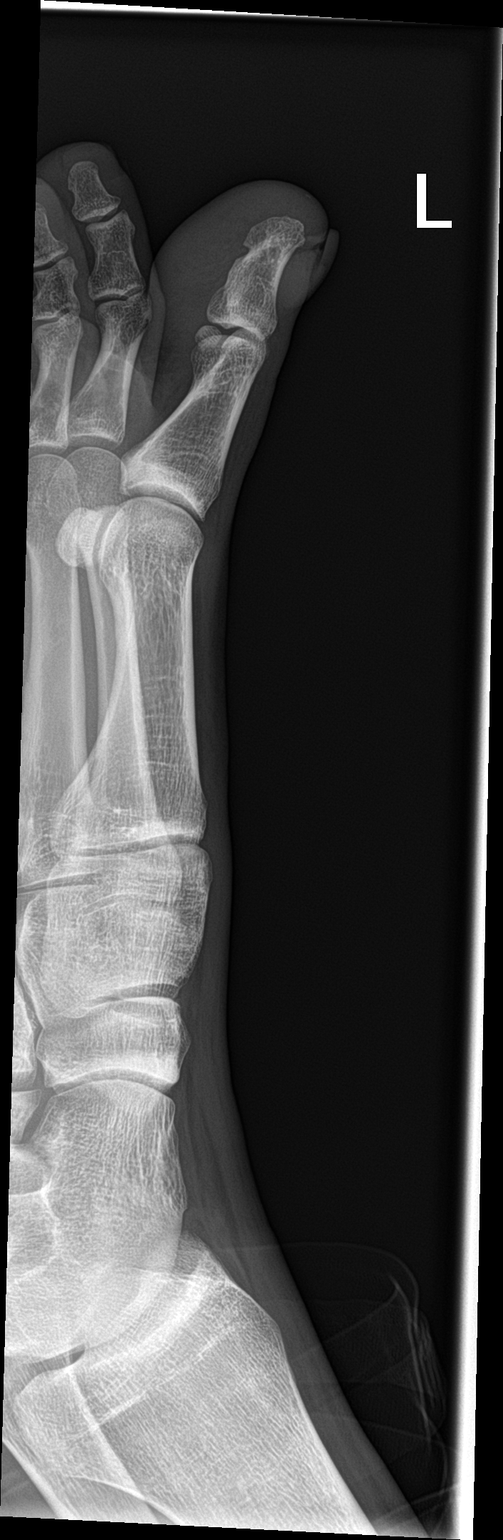

[3 of 3 positions shown; findings below may reference images not displayed]

FINDINGS: No fracture or dislocation. No suspicious focal osseous lesions. No
erosions or periosteal reaction. No radiopaque foreign bodies or
tophaceous soft tissue calcifications.
IMPRESSION: No acute osseous abnormality.
